# Patient Record
Sex: Female | Born: 1955 | Race: White | Hispanic: No | Smoking: Never smoker
Health system: Southern US, Community
[De-identification: ages and names within clinical notes are randomized; demographics above are authoritative.]

## PROBLEM LIST (undated history)

## (undated) DIAGNOSIS — F329 Major depressive disorder, single episode, unspecified: Secondary | ICD-10-CM

## (undated) DIAGNOSIS — E785 Hyperlipidemia, unspecified: Secondary | ICD-10-CM

## (undated) DIAGNOSIS — R0789 Other chest pain: Secondary | ICD-10-CM

## (undated) DIAGNOSIS — F32A Depression, unspecified: Secondary | ICD-10-CM

## (undated) DIAGNOSIS — G4730 Sleep apnea, unspecified: Secondary | ICD-10-CM

## (undated) DIAGNOSIS — K635 Polyp of colon: Secondary | ICD-10-CM

## (undated) HISTORY — DX: Polyp of colon: K63.5

## (undated) HISTORY — DX: Other chest pain: R07.89

## (undated) HISTORY — DX: Hyperlipidemia, unspecified: E78.5

## (undated) HISTORY — PX: COLONOSCOPY: SHX174

## (undated) HISTORY — DX: Depression, unspecified: F32.A

## (undated) HISTORY — DX: Major depressive disorder, single episode, unspecified: F32.9

---
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
## 1984-11-05 HISTORY — PX: EXPLORATORY LAPAROTOMY: SUR591

## 1999-06-07 ENCOUNTER — Other Ambulatory Visit: Admission: RE | Admit: 1999-06-07 | Discharge: 1999-06-07 | Payer: Self-pay | Admitting: Obstetrics & Gynecology

## 1999-06-07 ENCOUNTER — Encounter (INDEPENDENT_AMBULATORY_CARE_PROVIDER_SITE_OTHER): Payer: Self-pay

## 2001-11-05 HISTORY — PX: TOTAL ABDOMINAL HYSTERECTOMY W/ BILATERAL SALPINGOOPHORECTOMY: SHX83

## 2002-11-05 HISTORY — PX: HERNIA REPAIR: SHX51

## 2007-12-16 ENCOUNTER — Emergency Department (HOSPITAL_COMMUNITY): Admission: EM | Admit: 2007-12-16 | Discharge: 2007-12-16 | Payer: Self-pay | Admitting: Emergency Medicine

## 2009-03-18 IMAGING — CT CT ABDOMEN W/ CM
1 of 3 series · 14 of 32 positions shown, 19 images · IV contrast (Omnipaque 300)
Comparison: none

HISTORY: Mid abdominal pain, nausea, vomiting, diabetes

[Series 2: abd_pel 5.0 b40f · axial · 0.79mm/px · z∈[-510,-80]mm · 14 of 98 slices shown, 19 images]
[im 6/98  soft-tissue]
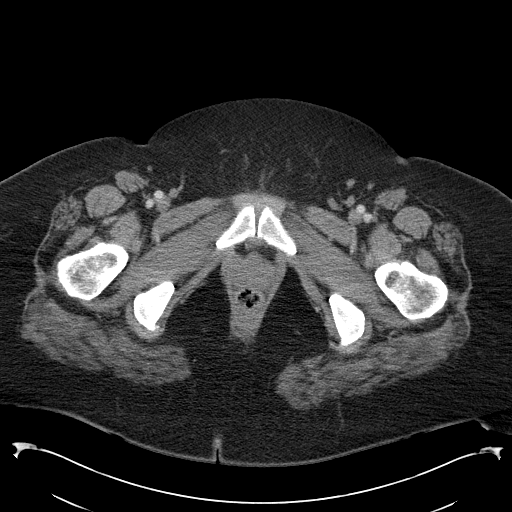
[im 6/98  bone]
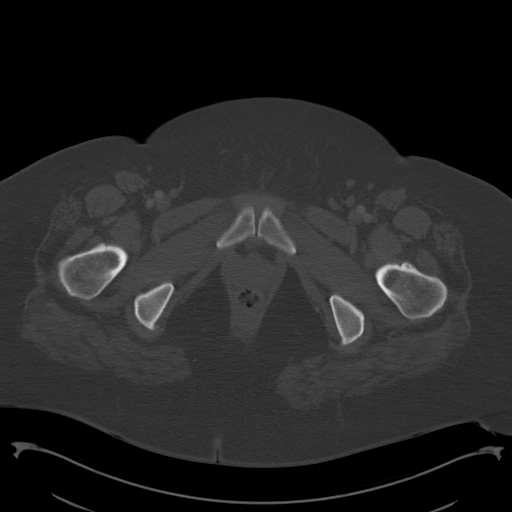
[im 12/98  soft-tissue]
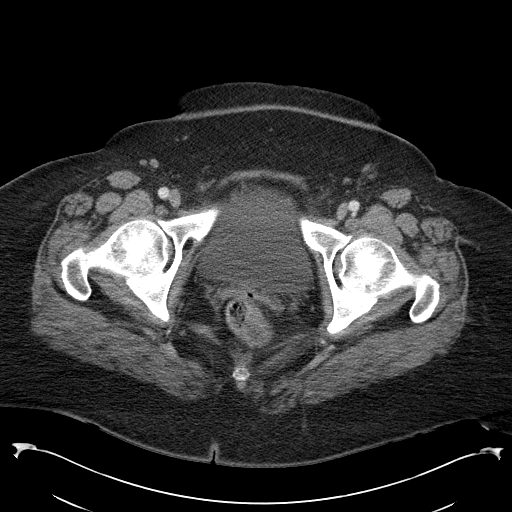
[im 23/98  soft-tissue]
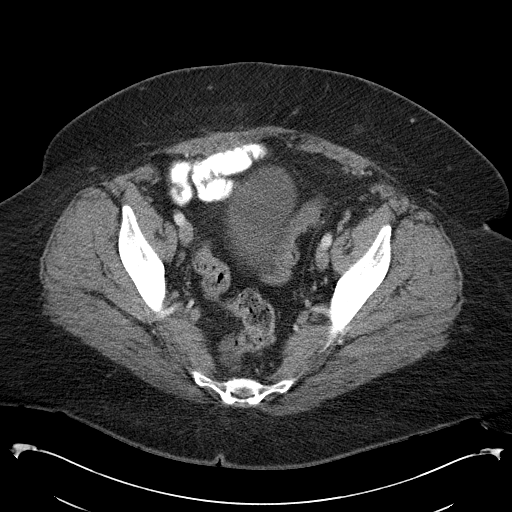
[im 29/98  soft-tissue]
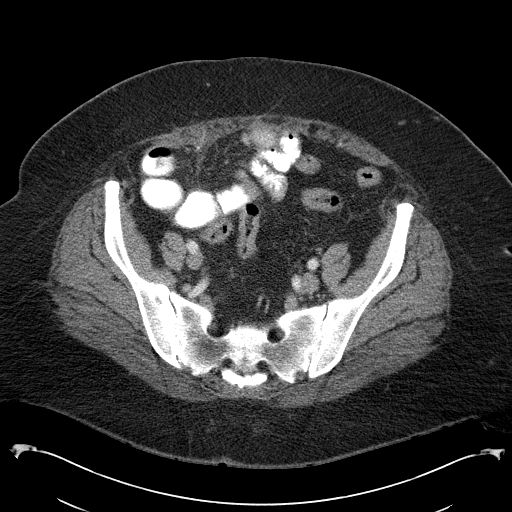
[im 35/98  soft-tissue]
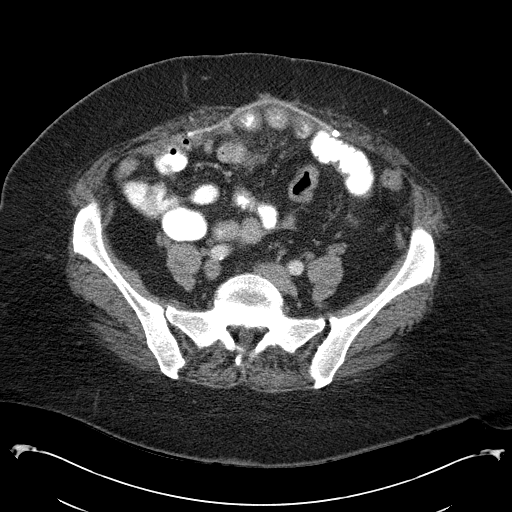
[im 40/98  soft-tissue]
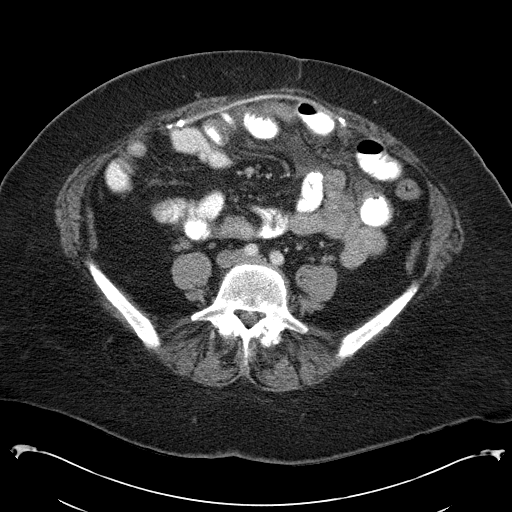
[im 52/98  soft-tissue]
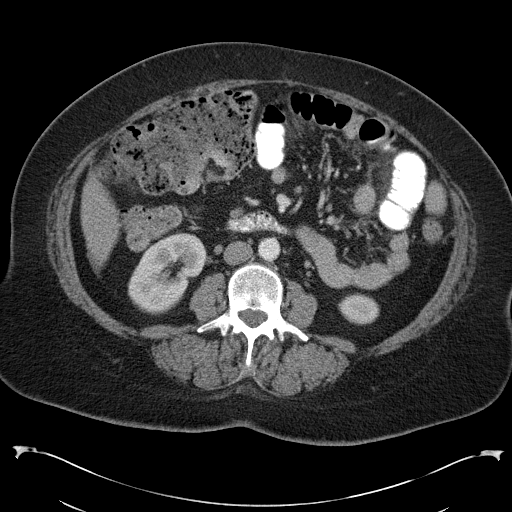
[im 58/98  soft-tissue]
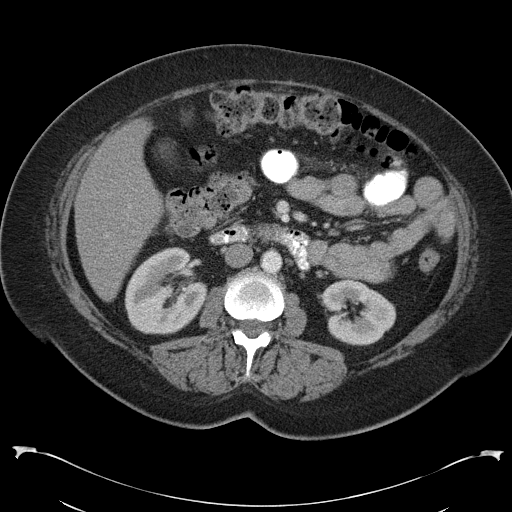
[im 63/98  soft-tissue]
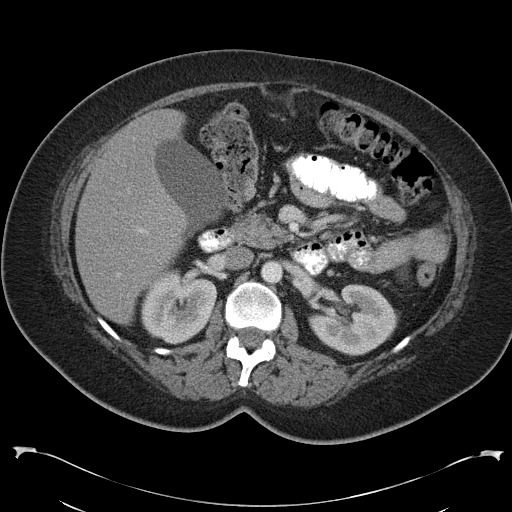
[im 63/98  bone]
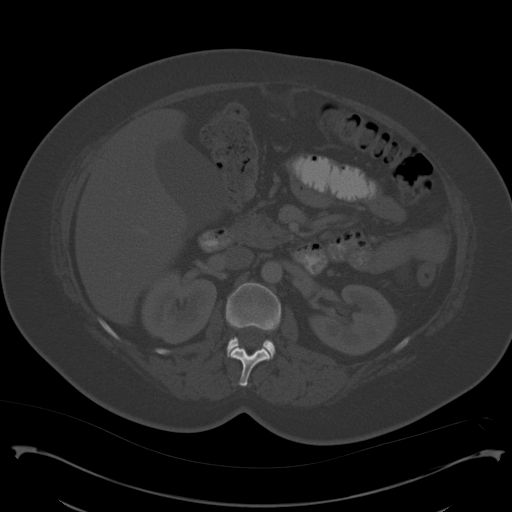
[im 69/98  soft-tissue]
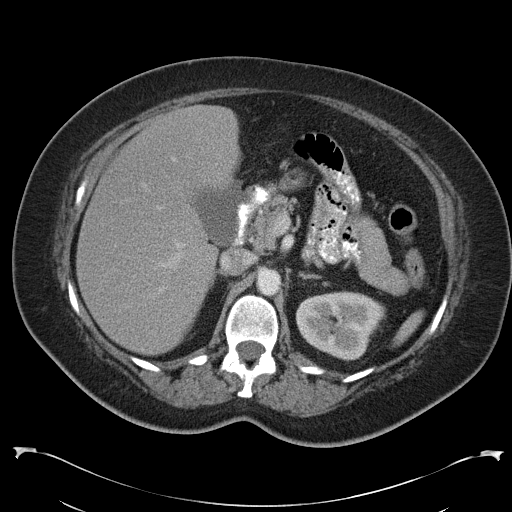
[im 75/98  soft-tissue]
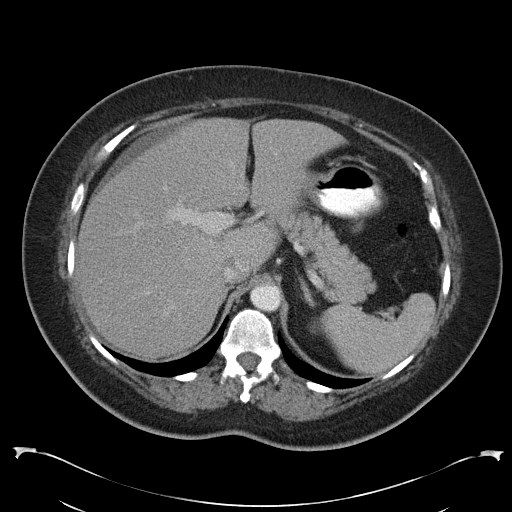
[im 75/98  lung]
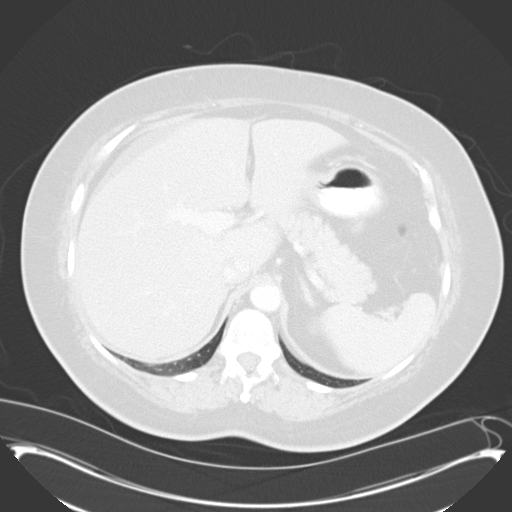
[im 80/98  lung]
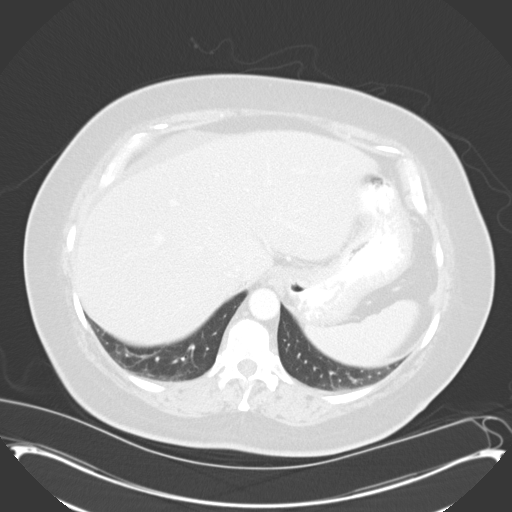
[im 86/98  soft-tissue]
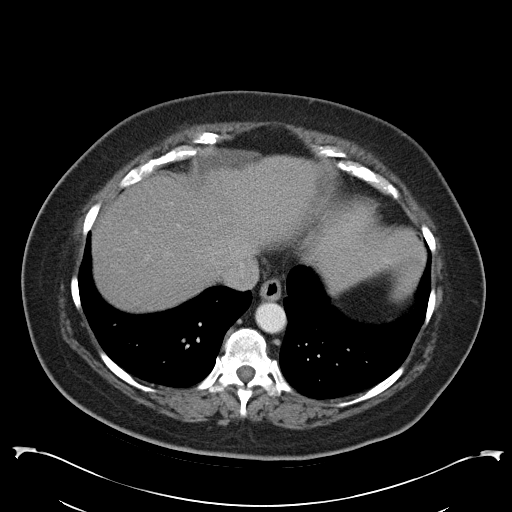
[im 86/98  lung]
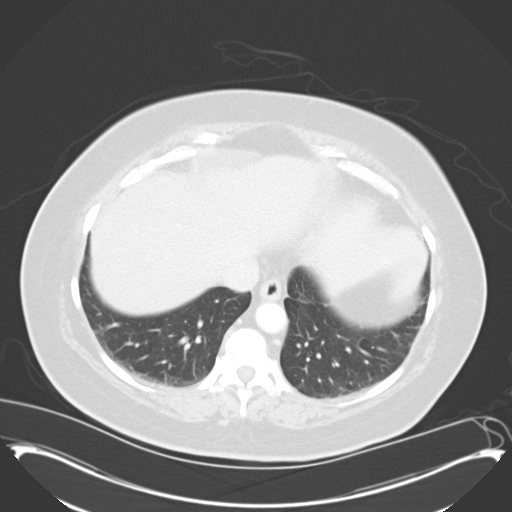
[im 92/98  soft-tissue]
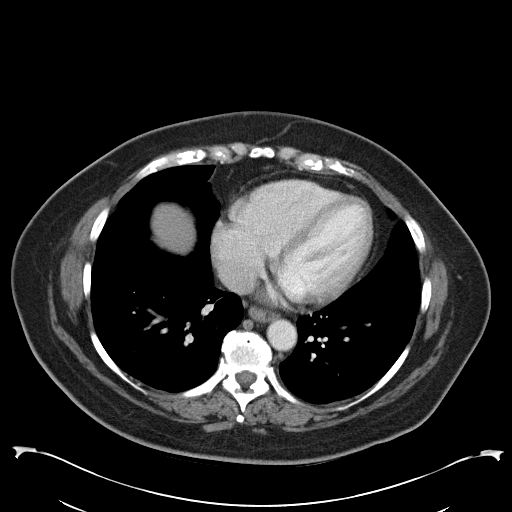
[im 92/98  lung]
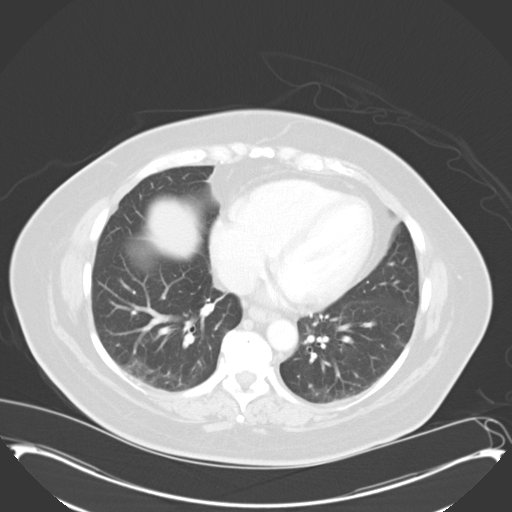

[14 of 32 positions shown; findings below may reference images not displayed]

CT ABDOMEN AND PELVIS WITH CONTRAST:

Multidetector helical CT imaging abdomen and pelvis performed.
Sagittal and coronal images are reconstructed from the axial data set.
Exam utilized dilute oral contrast and 100 cc Hmnipaque-TPP.
No prior exam for comparison.

CT ABDOMEN:

Minimal dependent atelectasis at lung bases.
Small amount of free perihepatic fluid and upper abdomen.
Liver, spleen, pancreas, and kidneys, and adrenal glands normal.
Gallbladder appears distended without calcification or definite wall thickening.
Increased stool in proximal half of colon.
Prior ventral hernia repair with mesh.
Few minimally prominent proximal small bowel loops without definite obstruction.
Minimal mesenteric edema of these associated small bowel loops.
No upper abdominal mass, adenopathy, or free fluid.
IMPRESSION: Small amount nonspecific perihepatic fluid, as well as minimally prominent small
bowel loops with mesenteric edema, see below.

CT PELVIS:

Mild edema identified within small bowel mesentery in upper mid pelvis.
This extends into the left midabdomen.
Few small bowel loops in this region show questionably thickened wall of these
are under distended by contrast and may be artifactual.
Distal small bowel loops unopacified.
No associated abscess collection, extraluminal gas, or free air.
Colon decompressed.
Bladder and distal ureters unremarkable.
No bone lesions.
IMPRESSION: Inflammatory process involving the mesentery of small bowel loops in the upper
pelvis extending into the mid left abdomen, nonspecific.
Differential diagnosis would include early small bowel obstruction, internal
hernia, adhesion, cannot completely exclude ischemia.
Followup exams as clinically warranted.

## 2009-08-04 DIAGNOSIS — K56609 Unspecified intestinal obstruction, unspecified as to partial versus complete obstruction: Secondary | ICD-10-CM | POA: Insufficient documentation

## 2009-08-04 DIAGNOSIS — E119 Type 2 diabetes mellitus without complications: Secondary | ICD-10-CM | POA: Insufficient documentation

## 2009-08-04 DIAGNOSIS — F329 Major depressive disorder, single episode, unspecified: Secondary | ICD-10-CM

## 2009-08-04 DIAGNOSIS — E785 Hyperlipidemia, unspecified: Secondary | ICD-10-CM

## 2009-08-10 ENCOUNTER — Encounter (INDEPENDENT_AMBULATORY_CARE_PROVIDER_SITE_OTHER): Payer: Self-pay | Admitting: *Deleted

## 2009-08-10 ENCOUNTER — Ambulatory Visit: Payer: Self-pay | Admitting: Cardiology

## 2009-08-10 DIAGNOSIS — R079 Chest pain, unspecified: Secondary | ICD-10-CM

## 2009-08-18 ENCOUNTER — Ambulatory Visit (HOSPITAL_COMMUNITY): Admission: RE | Admit: 2009-08-18 | Discharge: 2009-08-18 | Payer: Self-pay | Admitting: Cardiology

## 2009-08-18 ENCOUNTER — Encounter: Payer: Self-pay | Admitting: Cardiology

## 2009-08-18 ENCOUNTER — Ambulatory Visit: Payer: Self-pay | Admitting: Cardiology

## 2009-08-25 ENCOUNTER — Encounter (INDEPENDENT_AMBULATORY_CARE_PROVIDER_SITE_OTHER): Payer: Self-pay | Admitting: *Deleted

## 2009-09-09 ENCOUNTER — Encounter: Payer: Self-pay | Admitting: Cardiology

## 2010-08-23 ENCOUNTER — Ambulatory Visit: Payer: Self-pay | Admitting: Specialist

## 2010-11-05 HISTORY — PX: LAPAROSCOPIC GASTRIC SLEEVE RESECTION: SHX5895

## 2011-04-17 ENCOUNTER — Ambulatory Visit: Payer: Self-pay | Admitting: Specialist

## 2011-04-17 DIAGNOSIS — Z01810 Encounter for preprocedural cardiovascular examination: Secondary | ICD-10-CM

## 2011-05-24 ENCOUNTER — Ambulatory Visit: Payer: Self-pay | Admitting: Specialist

## 2011-06-06 ENCOUNTER — Ambulatory Visit: Payer: Self-pay | Admitting: Specialist

## 2011-06-06 ENCOUNTER — Ambulatory Visit: Payer: Self-pay | Admitting: Gastroenterology

## 2011-06-07 LAB — PATHOLOGY REPORT

## 2011-06-19 ENCOUNTER — Ambulatory Visit: Payer: Self-pay | Admitting: Specialist

## 2011-07-27 LAB — URINALYSIS, ROUTINE W REFLEX MICROSCOPIC
Glucose, UA: NEGATIVE
Leukocytes, UA: NEGATIVE
Specific Gravity, Urine: >1.03 — ABNORMAL HIGH
pH: 6

## 2011-07-27 LAB — DIFFERENTIAL
Basophils Relative: 0
Eosinophils Absolute: 0
Eosinophils Relative: 0
Monocytes Relative: 2 — ABNORMAL LOW
Neutrophils Relative %: 92 — ABNORMAL HIGH

## 2011-07-27 LAB — URINE MICROSCOPIC-ADD ON: Urine-Other: NONE SEEN

## 2011-07-27 LAB — COMPREHENSIVE METABOLIC PANEL
ALT: 42 — ABNORMAL HIGH
AST: 32
Alkaline Phosphatase: 74
CO2: 29
GFR calc Af Amer: >60
GFR calc non Af Amer: >60
Glucose, Bld: 193 — ABNORMAL HIGH
Potassium: 4.3
Sodium: 138
Total Protein: 6.8

## 2011-07-27 LAB — CBC
Hemoglobin: 14.6
RBC: 4.72

## 2011-08-01 ENCOUNTER — Ambulatory Visit: Payer: Self-pay | Admitting: Specialist

## 2011-08-07 ENCOUNTER — Ambulatory Visit: Payer: Self-pay | Admitting: Specialist

## 2011-08-28 ENCOUNTER — Inpatient Hospital Stay: Payer: Self-pay | Admitting: Specialist

## 2011-08-30 LAB — PATHOLOGY REPORT

## 2011-10-31 ENCOUNTER — Encounter: Payer: Self-pay | Admitting: Cardiology

## 2015-08-03 DIAGNOSIS — R300 Dysuria: Secondary | ICD-10-CM | POA: Insufficient documentation

## 2015-11-18 ENCOUNTER — Encounter: Payer: Self-pay | Admitting: *Deleted

## 2015-12-06 ENCOUNTER — Ambulatory Visit (INDEPENDENT_AMBULATORY_CARE_PROVIDER_SITE_OTHER): Payer: Federal, State, Local not specified - PPO | Admitting: General Surgery

## 2015-12-06 ENCOUNTER — Encounter: Payer: Self-pay | Admitting: General Surgery

## 2015-12-06 VITALS — BP 112/62 | HR 74 | Resp 14 | Ht 67.5 in | Wt 203.0 lb

## 2015-12-06 DIAGNOSIS — Z86010 Personal history of colonic polyps: Secondary | ICD-10-CM | POA: Diagnosis not present

## 2015-12-06 DIAGNOSIS — K625 Hemorrhage of anus and rectum: Secondary | ICD-10-CM | POA: Diagnosis not present

## 2015-12-06 MED ORDER — POLYETHYLENE GLYCOL 3350 17 GM/SCOOP PO POWD: 1.0000 | Freq: Once | ORAL | Status: DC

## 2015-12-06 NOTE — Patient Instructions (Addendum)
Colonoscopy A colonoscopy is an exam to look at the entire large intestine (colon). This exam can help find problems such as tumors, polyps, inflammation, and areas of bleeding. The exam takes about 1 hour.  LET [facility] CARE PROVIDER KNOW ABOUT:   Any allergies you have.  All medicines you are taking, including vitamins, herbs, eye drops, creams, and over-the-counter medicines.  Previous problems you or members of your family have had with the use of anesthetics.  Any blood disorders you have.  Previous surgeries you have had.  Medical conditions you have. RISKS AND COMPLICATIONS  Generally, this is a safe procedure. However, as with any procedure, complications can occur. Possible complications include:  Bleeding.  Tearing or rupture of the colon wall.  Reaction to medicines given during the exam.  Infection (rare). BEFORE THE PROCEDURE   Ask your health care provider about changing or stopping your regular medicines.  You may be prescribed an oral bowel prep. This involves drinking a large amount of medicated liquid, starting the day before your procedure. The liquid will cause you to have multiple loose stools until your stool is almost clear or light green. This cleans out your colon in preparation for the procedure.  Do not eat or drink anything else once you have started the bowel prep, unless your health care provider tells you it is safe to do so.  Arrange for someone to drive you home after the procedure. PROCEDURE   You will be given medicine to help you relax (sedative).  You will lie on your side with your knees bent.  A long, flexible tube with a light and camera on the end (colonoscope) will be inserted through the rectum and into the colon. The camera sends video back to a computer screen as it moves through the colon. The colonoscope also releases carbon dioxide gas to inflate the colon. This helps your health care provider see the area better.  During  the exam, your health care provider may take a small tissue sample (biopsy) to be examined under a microscope if any abnormalities are found.  The exam is finished when the entire colon has been viewed. AFTER THE PROCEDURE   Do not drive for 24 hours after the exam.  You may have a small amount of blood in your stool.  You may pass moderate amounts of gas and have mild abdominal cramping or bloating. This is caused by the gas used to inflate your colon during the exam.  Ask when your test results will be ready and how you will get your results. Make sure you get your test results.   This information is not intended to replace advice given to you by your health care provider. Make sure you discuss any questions you have with your health care provider.   Document Released: 10/19/2000 Document Revised: 08/12/2013 Document Reviewed: 06/29/2013 Elsevier Interactive Patient Education [employer]  The patient is scheduled for a Colonoscopy at [facility] on 01/18/16. She will hold her Pioglitazone-Metformin the day of prep and procedure. She will stop her Fish Oil one week prior. They are aware to call the day before to get their arrival time. Miralax prescription has been sent into the patient's pharmacy. The patient is aware of date and instructions.

## 2015-12-06 NOTE — H&P (Signed)
HPI  Melinda Phelps is a 60 y.o. female here today for an evaluation of a screening colonoscopy and hemorrhoids. Patient states she notices some rectal bleeding and rectal pain from her hemorrhoids about twice a month lasting 1-2 days. She has had the hemorrhoids since her first pregnancy 30 years ago. She states they will occasionally swell.  She has been followed by [facility] gynecology Dr Newman Nip for vaginal issues.  Last colonoscopy was 2010?Marland Kitchen Records from that exam are not available for review.  Denies family history colon cancer. She is her today with her husband.  The patient had a 70 pound weight loss after gastric sleeve surgery in 2012. Her weight has been stable now for several years.  I personally reviewed the patient's history.  HPI  Past Medical History   Diagnosis  Date   .  Hyperlipidemia    .  Diabetes mellitus    .  Chest discomfort    .  Depression    .  Colon polyp  2010?    Past Surgical History   Procedure  Laterality  Date   .  Exploratory laparotomy   1986   .  Total abdominal hysterectomy w/ bilateral salpingoophorectomy   2003   .  Hernia repair   2004   .  Tonsillectomy   1964   .  Cesarean section   1986, 1991, 1997   .  Laparoscopic gastric sleeve resection   2012     Dr Darnell Level   .  Colonoscopy   2010?     Dr Tomasa Rand in [place]    Family History   Problem  Relation  Age of Onset   .  Heart failure  Father    .  Heart attack  Brother    .  Cancer  Mother  49     lung    Social History  Social History   Substance Use Topics   .  Smoking status:  Never Smoker   .  Smokeless tobacco:  Not on file   .  Alcohol Use:  No    No Known Allergies  Current Outpatient Prescriptions   Medication  Sig  Dispense  Refill   .  aspirin 81 MG tablet  Take 81 mg by mouth daily.     Marland Kitchen  atorvastatin (LIPITOR) 40 MG tablet  Take 40 mg by mouth daily.     .  clobetasol cream (TEMOVATE) 3.71 %  Apply 1 application topically as directed.     .  desloratadine (CLARINEX)  5 MG tablet  Take 5 mg by mouth daily.     .  enalapril (VASOTEC) 10 MG tablet  Take 10 mg by mouth daily.     Marland Kitchen  escitalopram (LEXAPRO) 20 MG tablet  Take 20 mg by mouth daily.     .  Estradiol-Estriol-Progesterone (BIEST/PROGESTERONE) CREA  Place onto the skin as directed.     .  estrogens conjugated, synthetic A, (CENESTIN) 0.45 MG tablet  Take 0.45 mg by mouth daily.     .  fish oil-omega-3 fatty acids 1000 MG capsule  Take 1 capsule by mouth daily.     .  Multiple Vitamin (MULTIVITAMIN) tablet  Take 1 tablet by mouth daily.     .  pioglitazone-metformin (ACTOPLUS MET) 15-500 MG per tablet  Take 1 tablet by mouth 2 (two) times daily with a meal.     .  polyethylene glycol powder (GLYCOLAX/MIRALAX) powder  Take 255 g by mouth  once.  255 g  0    No current facility-administered medications for this visit.    Review of Systems  Review of Systems  Constitutional: Negative.  Respiratory: Negative.  Cardiovascular: Negative.  Gastrointestinal: Positive for anal bleeding and rectal pain. Negative for vomiting, diarrhea and constipation.   Blood pressure 112/62, pulse 74, resp. rate 14, height 5' 7.5" (1.715 m), weight 203 lb (92.08 kg).  Physical Exam  Physical Exam  Constitutional: She is oriented to person, place, and time. She appears well-developed and well-nourished.  HENT:  Mouth/Throat: Oropharynx is clear and moist.  Eyes: Conjunctivae are normal. No scleral icterus.  Neck: Neck supple.  Cardiovascular: Normal rate, regular rhythm and normal heart sounds.  Pulses:  Femoral pulses are 2+ on the right side, and 2+ on the left side.  Posterior tibial pulses are 2+ on the right side, and 2+ on the left side.  Pulmonary/Chest: Effort normal and breath sounds normal.  Abdominal: Soft. Normal appearance and bowel sounds are normal. There is no tenderness. No hernia.  Genitourinary: Rectal exam shows internal hemorrhoid. Guaiac negative stool.     Small external skin tags. Small  internal hemorrhoids.    Lymphadenopathy:  She has no cervical adenopathy.  Neurological: She is alert and oriented to person, place, and time.  Skin: Skin is warm and dry.  Psychiatric: Her behavior is normal.   Data Reviewed  PCP notes of 11/17/2015 were reviewed. Hemoglobin 13.3 with an MCV of 91. White blood cell count of 8900. Normal electrolytes with the exception of a minimal elevation of the sodium at 145. Creatinine low at 0.5. Blood sugar 162 (unclear if this is a fasting sample) sees. Normal liver function studies.  Anoscopy showed some very small 2-3 mm internal hemorrhoids. No bleeding source identified.  Assessment   Past history colonic polyps, recent history of rectal bleeding.   Plan   Colonoscopy with possible biopsy/polypectomy prn: Information regarding the procedure, including its potential risks and complications (including but not limited to perforation of the bowel, which may require emergency surgery to repair, and bleeding) was verbally given to the patient. Educational information regarding lower intestinal endoscopy was given to the patient. Written instructions for how to complete the bowel prep using Miralax were provided. The importance of drinking ample fluids to avoid dehydration as a result of the prep emphasized.  This information has been scribed by Karie Fetch RNBC.  The patient is scheduled for a Colonoscopy at [facility] on 01/18/16. She will hold her Pioglitazone-Metformin the day of prep and procedure. She will stop her Fish Oil one week prior. They are aware to call the day before to get their arrival time. Miralax prescription has been sent into the patient's pharmacy. The patient is aware of date and instructions.  PCP: Hyman Hopes

## 2015-12-06 NOTE — Progress Notes (Signed)
Patient ID: Melinda Phelps, female   DOB: 12/02/1955, 60 y.o.   MRN: [id number]  Chief Complaint  Patient presents with  . Colonoscopy    hemorrhoids    HPI Melinda Phelps is a 60 y.o. female here today for an evaluation of a screening colonoscopy and hemorrhoids. Patient states she notices some rectal bleeding and rectal pain from her hemorrhoids about twice a month lasting 1-2 days. She has had the hemorrhoids since her first pregnancy 30 years ago. She states they will occasionally swell. She has been followed by [facility] gynecology Dr Newman Nip for vaginal issues. Last colonoscopy was 2010?Marland Kitchen Records from that exam are not available for review. Denies family history colon cancer. She is her today with her husband.  The patient had a 70 pound weight loss after gastric sleeve surgery in 2012. Her weight has been stable now for several years.  I personally reviewed the patient's history.   HPI  Past Medical History  Diagnosis Date  . Hyperlipidemia   . Diabetes mellitus   . Chest discomfort   . Depression   . Colon polyp 2010?    Past Surgical History  Procedure Laterality Date  . Exploratory laparotomy  1986  . Total abdominal hysterectomy w/ bilateral salpingoophorectomy  2003  . Hernia repair  2004  . Tonsillectomy  1964  . Cesarean section  1986, 1991, 1997  . Laparoscopic gastric sleeve resection  2012    Dr Darnell Level  . Colonoscopy  2010?    Dr Tomasa Rand in [place]    Family History  Problem Relation Age of Onset  . Heart failure Father   . Heart attack Brother   . Cancer Mother 48    lung    Social History Social History  Substance Use Topics  . Smoking status: Never Smoker   . Smokeless tobacco: Not on file  . Alcohol Use: No    No Known Allergies  Current Outpatient Prescriptions  Medication Sig Dispense Refill  . aspirin 81 MG tablet Take 81 mg by mouth daily.      Marland Kitchen atorvastatin (LIPITOR) 40 MG tablet Take 40 mg by mouth daily.      . clobetasol  cream (TEMOVATE) 8.84 % Apply 1 application topically as directed.    . desloratadine (CLARINEX) 5 MG tablet Take 5 mg by mouth daily.      . enalapril (VASOTEC) 10 MG tablet Take 10 mg by mouth daily.      Marland Kitchen escitalopram (LEXAPRO) 20 MG tablet Take 20 mg by mouth daily.      . Estradiol-Estriol-Progesterone (BIEST/PROGESTERONE) CREA Place onto the skin as directed.    . estrogens conjugated, synthetic A, (CENESTIN) 0.45 MG tablet Take 0.45 mg by mouth daily.      . fish oil-omega-3 fatty acids 1000 MG capsule Take 1 capsule by mouth daily.      . Multiple Vitamin (MULTIVITAMIN) tablet Take 1 tablet by mouth daily.      . pioglitazone-metformin (ACTOPLUS MET) 15-500 MG per tablet Take 1 tablet by mouth 2 (two) times daily with a meal.      . polyethylene glycol powder (GLYCOLAX/MIRALAX) powder Take 255 g by mouth once. 255 g 0   No current facility-administered medications for this visit.    Review of Systems Review of Systems  Constitutional: Negative.   Respiratory: Negative.   Cardiovascular: Negative.   Gastrointestinal: Positive for anal bleeding and rectal pain. Negative for vomiting, diarrhea and constipation.    Blood pressure 112/62,  pulse 74, resp. rate 14, height 5' 7.5" (1.715 m), weight 203 lb (92.08 kg).  Physical Exam Physical Exam  Constitutional: She is oriented to person, place, and time. She appears well-developed and well-nourished.  HENT:  Mouth/Throat: Oropharynx is clear and moist.  Eyes: Conjunctivae are normal. No scleral icterus.  Neck: Neck supple.  Cardiovascular: Normal rate, regular rhythm and normal heart sounds.   Pulses:      Femoral pulses are 2+ on the right side, and 2+ on the left side.      Posterior tibial pulses are 2+ on the right side, and 2+ on the left side.  Pulmonary/Chest: Effort normal and breath sounds normal.  Abdominal: Soft. Normal appearance and bowel sounds are normal. There is no tenderness. No hernia.  Genitourinary: Rectal  exam shows internal hemorrhoid. Guaiac negative stool.     Small external skin tags. Small internal hemorrhoids.     Lymphadenopathy:    She has no cervical adenopathy.  Neurological: She is alert and oriented to person, place, and time.  Skin: Skin is warm and dry.  Psychiatric: Her behavior is normal.    Data Reviewed PCP notes of 11/17/2015 were reviewed. Hemoglobin 13.3 with an MCV of 91. White blood cell count of 8900. Normal electrolytes with the exception of a minimal elevation of the sodium at 145. Creatinine low at 0.5. Blood sugar 162 (unclear if this is a fasting sample) sees. Normal liver function studies.  Anoscopy showed some very small 2-3 mm internal hemorrhoids. No bleeding source identified.  Assessment    Past history colonic polyps, recent history of rectal bleeding.    Plan       Colonoscopy with possible biopsy/polypectomy prn: Information regarding the procedure, including its potential risks and complications (including but not limited to perforation of the bowel, which may require emergency surgery to repair, and bleeding) was verbally given to the patient. Educational information regarding lower intestinal endoscopy was given to the patient. Written instructions for how to complete the bowel prep using Miralax were provided. The importance of drinking ample fluids to avoid dehydration as a result of the prep emphasized.  This information has been scribed by Karie Fetch RNBC.  The patient is scheduled for a Colonoscopy at [facility] on 01/18/16. She will hold her Pioglitazone-Metformin the day of prep and procedure. She will stop her Fish Oil one week prior. They are aware to call the day before to get their arrival time. Miralax prescription has been sent into the patient's pharmacy. The patient is aware of date and instructions.   PCP:  Hyman Hopes 12/06/2015, 9:06 PM

## 2015-12-12 ENCOUNTER — Ambulatory Visit: Payer: Self-pay | Admitting: General Surgery

## 2016-01-16 ENCOUNTER — Telehealth: Payer: Self-pay | Admitting: *Deleted

## 2016-01-16 NOTE — Telephone Encounter (Signed)
Patient contacted today and medication list was reviewed with the patient. Medication list was updated accordingly.  This patient reports that she has Miralax prescription.   We will proceed with colonoscopy as scheduled for 01-18-16 at [facility].   Patient was instructed to contact the office should she have further questions.

## 2016-01-17 ENCOUNTER — Encounter: Payer: Self-pay | Admitting: *Deleted

## 2016-01-18 ENCOUNTER — Encounter
Admission: RE | Disposition: A | Discharge status: Home / Self Care | Payer: Self-pay | Source: Ambulatory Visit | Attending: General Surgery

## 2016-01-18 ENCOUNTER — Ambulatory Visit
Admission: RE | Admit: 2016-01-18 | Discharge: 2016-01-18 | Disposition: A | Discharge status: Home / Self Care | Payer: Federal, State, Local not specified - PPO | Source: Ambulatory Visit | Attending: General Surgery | Admitting: General Surgery

## 2016-01-18 ENCOUNTER — Ambulatory Visit: Payer: Federal, State, Local not specified - PPO | Admitting: Anesthesiology

## 2016-01-18 ENCOUNTER — Encounter: Payer: Self-pay | Admitting: *Deleted

## 2016-01-18 DIAGNOSIS — Z79899 Other long term (current) drug therapy: Secondary | ICD-10-CM | POA: Diagnosis not present

## 2016-01-18 DIAGNOSIS — Z86010 Personal history of colonic polyps: Secondary | ICD-10-CM | POA: Insufficient documentation

## 2016-01-18 DIAGNOSIS — Z7984 Long term (current) use of oral hypoglycemic drugs: Secondary | ICD-10-CM | POA: Insufficient documentation

## 2016-01-18 DIAGNOSIS — F329 Major depressive disorder, single episode, unspecified: Secondary | ICD-10-CM | POA: Insufficient documentation

## 2016-01-18 DIAGNOSIS — G4730 Sleep apnea, unspecified: Secondary | ICD-10-CM | POA: Diagnosis not present

## 2016-01-18 DIAGNOSIS — E119 Type 2 diabetes mellitus without complications: Secondary | ICD-10-CM | POA: Diagnosis not present

## 2016-01-18 DIAGNOSIS — K625 Hemorrhage of anus and rectum: Secondary | ICD-10-CM | POA: Insufficient documentation

## 2016-01-18 DIAGNOSIS — Z1211 Encounter for screening for malignant neoplasm of colon: Secondary | ICD-10-CM | POA: Diagnosis not present

## 2016-01-18 DIAGNOSIS — R079 Chest pain, unspecified: Secondary | ICD-10-CM | POA: Insufficient documentation

## 2016-01-18 DIAGNOSIS — E785 Hyperlipidemia, unspecified: Secondary | ICD-10-CM | POA: Insufficient documentation

## 2016-01-18 DIAGNOSIS — Z90710 Acquired absence of both cervix and uterus: Secondary | ICD-10-CM | POA: Diagnosis not present

## 2016-01-18 HISTORY — PX: COLONOSCOPY WITH PROPOFOL: SHX5780

## 2016-01-18 HISTORY — DX: Sleep apnea, unspecified: G47.30

## 2016-01-18 LAB — GLUCOSE, CAPILLARY: Glucose-Capillary: 168 mg/dL — ABNORMAL HIGH (ref 65–99)

## 2016-01-18 SURGERY — COLONOSCOPY WITH PROPOFOL
Anesthesia: General

## 2016-01-18 MED ORDER — FENTANYL CITRATE (PF) 100 MCG/2ML IJ SOLN: 25.0000 ug | INTRAMUSCULAR | Status: DC | PRN

## 2016-01-18 MED ORDER — PROPOFOL 10 MG/ML IV BOLUS
INTRAVENOUS | Status: DC | PRN
  Administered 2016-01-18: 50 mg via INTRAVENOUS

## 2016-01-18 MED ORDER — GLYCOPYRROLATE 0.2 MG/ML IJ SOLN
INTRAMUSCULAR | Status: DC | PRN
  Administered 2016-01-18: 0.2 mg via INTRAVENOUS

## 2016-01-18 MED ORDER — MIDAZOLAM HCL 2 MG/2ML IJ SOLN
INTRAMUSCULAR | Status: DC | PRN
  Administered 2016-01-18: 1 mg via INTRAVENOUS

## 2016-01-18 MED ORDER — ONDANSETRON HCL 4 MG/2ML IJ SOLN: 4.0000 mg | Freq: Once | INTRAMUSCULAR | Status: DC | PRN

## 2016-01-18 MED ORDER — LIDOCAINE HCL (CARDIAC) 20 MG/ML IV SOLN
INTRAVENOUS | Status: DC | PRN
  Administered 2016-01-18: 60 mg via INTRAVENOUS

## 2016-01-18 MED ORDER — SODIUM CHLORIDE 0.9 % IV SOLN
INTRAVENOUS | Status: DC
  Administered 2016-01-18: 10:00:00 via INTRAVENOUS
  Administered 2016-01-18: 1000 mL via INTRAVENOUS

## 2016-01-18 MED ORDER — PHENYLEPHRINE HCL 10 MG/ML IJ SOLN
INTRAMUSCULAR | Status: DC | PRN
  Administered 2016-01-18: 100 ug via INTRAVENOUS

## 2016-01-18 MED ORDER — PROPOFOL 500 MG/50ML IV EMUL
INTRAVENOUS | Status: DC | PRN
  Administered 2016-01-18: 130 ug/kg/min via INTRAVENOUS

## 2016-01-18 NOTE — Transfer of Care (Signed)
Immediate Anesthesia Transfer of Care Note  Patient: Melinda Phelps  Procedure(s) Performed: Procedure(s): COLONOSCOPY WITH PROPOFOL (N/A)  Patient Location: Endoscopy Unit  Anesthesia Type:General  Level of Consciousness: awake, alert , oriented and patient cooperative  Airway & Oxygen Therapy: Patient Spontanous Breathing and Patient connected to nasal cannula oxygen  Post-op Assessment: Report given to RN, Post -op Vital signs reviewed and stable and Patient moving all extremities X 4  Post vital signs: Reviewed and stable  Last Vitals:  Filed Vitals:   01/18/16 0925  BP: 139/82  Pulse: 54  Temp: 36.4 C  Resp: 18    Complications: No apparent anesthesia complications

## 2016-01-18 NOTE — H&P (Signed)
Melinda Phelps [id number] Mar 14, 1956     HPI: Past history rectal bleeding. No bleeding since January exam.  For colonoscopy.No change in health status. Tolerated prep well.   Prescriptions prior to admission  Medication Sig Dispense Refill Last Dose  . atorvastatin (LIPITOR) 40 MG tablet Take 40 mg by mouth daily.     Taking  . clobetasol cream (TEMOVATE) 0.05 % Apply 1 application topically as directed.     . desloratadine (CLARINEX) 5 MG tablet Take 5 mg by mouth daily.     Taking  . enalapril (VASOTEC) 10 MG tablet Take 10 mg by mouth daily.     Taking  . escitalopram (LEXAPRO) 20 MG tablet Take 20 mg by mouth daily.     Taking  . Estradiol-Estriol-Progesterone (BIEST/PROGESTERONE) CREA Place onto the skin as directed.     . estrogens conjugated, synthetic A, (CENESTIN) 0.45 MG tablet Take 0.45 mg by mouth daily.     Taking  . fish oil-omega-3 fatty acids 1000 MG capsule Take 1 capsule by mouth daily.     Taking  . metFORMIN (GLUCOPHAGE) 1000 MG tablet Take 1,000 mg by mouth 2 (two) times daily with a meal.     . Multiple Vitamin (MULTIVITAMIN) tablet Take 1 tablet by mouth daily.     Taking  . polyethylene glycol powder (GLYCOLAX/MIRALAX) powder Take 255 g by mouth once. 255 g 0    No Known Allergies Past Medical History  Diagnosis Date  . Hyperlipidemia   . Diabetes mellitus   . Chest discomfort   . Depression   . Colon polyp 2010?  Marland Kitchen. Sleep apnea    Past Surgical History  Procedure Laterality Date  . Exploratory laparotomy  1986  . Total abdominal hysterectomy w/ bilateral salpingoophorectomy  2003  . Hernia repair  2004  . Tonsillectomy  1964  . Cesarean section  1986, 1991, 1997  . Laparoscopic gastric sleeve resection  2012    Dr Smitty CordsBruce  . Colonoscopy  2010?    [place]   Social History   Social History  . Marital Status: Married    Spouse Name: N/A  . Number of Children: N/A  . Years of Education: N/A   Occupational History  .  [employer]   Social History Main Topics  . Smoking status: Never Smoker   . Smokeless tobacco: Not on file  . Alcohol Use: No  . Drug Use: No  . Sexual Activity: Not on file   Other Topics Concern  . Not on file   Social History Narrative   No regular exercise   Social History   Social History Narrative   No regular exercise     ROS: Negative.     PE: HEENT: Negative. Lungs: Clear. Cardio: RR. Melinda Phelps, Melinda Phelps W 01/18/2016   Assessment/Plan:  Proceed with planned endoscopy.

## 2016-01-18 NOTE — Anesthesia Preprocedure Evaluation (Signed)
Anesthesia Evaluation  Patient identified by MRN, date of birth, ID band Patient awake    Reviewed: Allergy & Precautions, NPO status , Patient's Chart, lab work & pertinent test results  Airway Mallampati: II  TM Distance: <3 FB     Dental no notable dental hx.    Pulmonary sleep apnea ,    Pulmonary exam normal        Cardiovascular Normal cardiovascular exam  Chest pain Hx   Neuro/Psych Depression negative neurological ROS     GI/Hepatic Neg liver ROS, Colon polyp Hx   Endo/Other  diabetes, Well Controlled, Type 2, Oral Hypoglycemic Agents  Renal/GU negative Renal ROS  negative genitourinary   Musculoskeletal   Abdominal Normal abdominal exam  (+)   Peds negative pediatric ROS (+)  Hematology   Anesthesia Other Findings   Reproductive/Obstetrics                             Anesthesia Physical Anesthesia Plan  ASA: III  Anesthesia Plan: General   Post-op Pain Management:    Induction: Intravenous  Airway Management Planned: Nasal Cannula  Additional Equipment:   Intra-op Plan:   Post-operative Plan:   Informed Consent: I have reviewed the patients History and Physical, chart, labs and discussed the procedure including the risks, benefits and alternatives for the proposed anesthesia with the patient or authorized representative who has indicated his/her understanding and acceptance.   Dental advisory given  Plan Discussed with: CRNA and Surgeon  Anesthesia Plan Comments:         Anesthesia Quick Evaluation

## 2016-01-18 NOTE — Anesthesia Postprocedure Evaluation (Signed)
Anesthesia Post Note  Patient: Melinda Phelps  Procedure(s) Performed: Procedure(s) (LRB): COLONOSCOPY WITH PROPOFOL (N/A)  Patient location during evaluation: PACU Anesthesia Type: General Level of consciousness: awake and alert and oriented Pain management: pain level controlled Vital Signs Assessment: post-procedure vital signs reviewed and stable Respiratory status: spontaneous breathing Cardiovascular status: blood pressure returned to baseline Anesthetic complications: no    Last Vitals:  Filed Vitals:   01/18/16 1055 01/18/16 1105  BP: 115/70 126/78  Pulse: 59 54  Temp:    Resp: 18 14    Last Pain: There were no vitals filed for this visit.               Brantleigh Mifflin

## 2016-01-18 NOTE — Op Note (Signed)
[name]: Melinda Phelps Procedure Date: 01/18/2016 9:51 AM MRN: [id number] Account #: [id number] Date of Birth: [birth year] Admit Type: Outpatient Age: [id number] Room: [facility] END[facility] ROOM 3 Gender: Female Note Status: Finalized Procedure:            Colonoscopy Indications:          Rectal bleeding Providers:            Earline MayotteJeffrey W. Darrek Leasure, MD Referring MD:         Erasmo DownerLindsey F. Strader (Referring MD) Medicines:            Monitored Anesthesia Care Complications:        No immediate complications. Procedure:            Pre-Anesthesia Assessment:                       - Prior to the procedure, a History and Physical was                        performed, and patient medications, allergies and                        sensitivities were reviewed. The patient's tolerance of                        previous anesthesia was reviewed.                       - The risks and benefits of the procedure and the                        sedation options and risks were discussed with the                        patient. All questions were answered and informed                        consent was obtained.                       After obtaining informed consent, the colonoscope was                        passed under direct vision. Throughout the procedure,                        the patient's blood pressure, pulse, and oxygen                        saturations were monitored continuously. The                        Colonoscope was introduced through the anus and                        advanced to the the cecum, identified by the                        appendiceal orifice, ileocecal valve and palpation. The  colonoscopy was somewhat difficult due to significant                        looping and a tortuous colon. Successful completion of                        the procedure was aided by using manual pressure. The                        patient  tolerated the procedure well. The quality of                        the bowel preparation was good. Findings:      The entire examined colon appeared normal on direct and retroflexion       views. Impression:           - The entire examined colon is normal on direct and                        retroflexion views.                       - No specimens collected. Recommendation:       - Repeat colonoscopy in 10 years for screening purposes. Procedure Code(s):    --- Professional ---                       [phone], Colonoscopy, flexible; diagnostic, including                        collection of specimen(s) by brushing or washing, when                        performed (separate procedure) Diagnosis Code(s):    --- Professional ---                       K62.5, Hemorrhage of anus and rectum CPT copyright 2016 American Medical Association. All rights reserved. The codes documented in this report are preliminary and upon coder review may  be revised to meet current compliance requirements. Earline Mayotte, MD 01/18/2016 10:32:57 AM This report has been signed electronically. Number of Addenda: 0 Note Initiated On: 01/18/2016 9:51 AM Scope Withdrawal Time: 0 hours 9 minutes 43 seconds  Total Procedure Duration: 0 hours 35 minutes 28 seconds       [facility]

## 2020-09-13 DIAGNOSIS — E119 Type 2 diabetes mellitus without complications: Secondary | ICD-10-CM | POA: Diagnosis not present

## 2020-09-13 DIAGNOSIS — Z23 Encounter for immunization: Secondary | ICD-10-CM | POA: Diagnosis not present

## 2020-09-13 DIAGNOSIS — Z6824 Body mass index (BMI) 24.0-24.9, adult: Secondary | ICD-10-CM | POA: Diagnosis not present

## 2020-09-13 DIAGNOSIS — F419 Anxiety disorder, unspecified: Secondary | ICD-10-CM | POA: Diagnosis not present

## 2021-01-17 DIAGNOSIS — S83241A Other tear of medial meniscus, current injury, right knee, initial encounter: Secondary | ICD-10-CM | POA: Diagnosis not present

## 2021-02-15 DIAGNOSIS — R6889 Other general symptoms and signs: Secondary | ICD-10-CM | POA: Diagnosis not present

## 2021-02-15 DIAGNOSIS — J329 Chronic sinusitis, unspecified: Secondary | ICD-10-CM | POA: Diagnosis not present

## 2021-03-02 DIAGNOSIS — E119 Type 2 diabetes mellitus without complications: Secondary | ICD-10-CM | POA: Diagnosis not present

## 2021-03-02 DIAGNOSIS — E559 Vitamin D deficiency, unspecified: Secondary | ICD-10-CM | POA: Diagnosis not present

## 2021-03-02 DIAGNOSIS — Z79899 Other long term (current) drug therapy: Secondary | ICD-10-CM | POA: Diagnosis not present

## 2021-03-02 DIAGNOSIS — E785 Hyperlipidemia, unspecified: Secondary | ICD-10-CM | POA: Diagnosis not present

## 2021-03-07 DIAGNOSIS — F419 Anxiety disorder, unspecified: Secondary | ICD-10-CM | POA: Diagnosis not present

## 2021-03-07 DIAGNOSIS — E119 Type 2 diabetes mellitus without complications: Secondary | ICD-10-CM | POA: Diagnosis not present

## 2021-03-07 DIAGNOSIS — Z6826 Body mass index (BMI) 26.0-26.9, adult: Secondary | ICD-10-CM | POA: Diagnosis not present

## 2021-03-07 DIAGNOSIS — F329 Major depressive disorder, single episode, unspecified: Secondary | ICD-10-CM | POA: Diagnosis not present

## 2021-03-10 DIAGNOSIS — N904 Leukoplakia of vulva: Secondary | ICD-10-CM | POA: Insufficient documentation

## 2021-07-14 DIAGNOSIS — E119 Type 2 diabetes mellitus without complications: Secondary | ICD-10-CM | POA: Diagnosis not present

## 2021-07-14 DIAGNOSIS — Z79899 Other long term (current) drug therapy: Secondary | ICD-10-CM | POA: Diagnosis not present

## 2021-07-14 DIAGNOSIS — E785 Hyperlipidemia, unspecified: Secondary | ICD-10-CM | POA: Diagnosis not present

## 2021-07-24 DIAGNOSIS — F329 Major depressive disorder, single episode, unspecified: Secondary | ICD-10-CM | POA: Diagnosis not present

## 2021-07-24 DIAGNOSIS — E119 Type 2 diabetes mellitus without complications: Secondary | ICD-10-CM | POA: Diagnosis not present

## 2021-07-24 DIAGNOSIS — F419 Anxiety disorder, unspecified: Secondary | ICD-10-CM | POA: Diagnosis not present

## 2021-07-24 DIAGNOSIS — Z79899 Other long term (current) drug therapy: Secondary | ICD-10-CM | POA: Diagnosis not present

## 2021-10-03 DIAGNOSIS — S83241D Other tear of medial meniscus, current injury, right knee, subsequent encounter: Secondary | ICD-10-CM | POA: Diagnosis not present

## 2022-01-10 DIAGNOSIS — E785 Hyperlipidemia, unspecified: Secondary | ICD-10-CM | POA: Diagnosis not present

## 2022-01-10 DIAGNOSIS — E119 Type 2 diabetes mellitus without complications: Secondary | ICD-10-CM | POA: Diagnosis not present

## 2022-01-10 DIAGNOSIS — E559 Vitamin D deficiency, unspecified: Secondary | ICD-10-CM | POA: Diagnosis not present

## 2022-01-10 DIAGNOSIS — Z79899 Other long term (current) drug therapy: Secondary | ICD-10-CM | POA: Diagnosis not present

## 2022-01-15 DIAGNOSIS — E119 Type 2 diabetes mellitus without complications: Secondary | ICD-10-CM | POA: Diagnosis not present

## 2022-01-15 DIAGNOSIS — Z79899 Other long term (current) drug therapy: Secondary | ICD-10-CM | POA: Diagnosis not present

## 2022-01-15 DIAGNOSIS — F419 Anxiety disorder, unspecified: Secondary | ICD-10-CM | POA: Diagnosis not present

## 2022-01-15 DIAGNOSIS — F329 Major depressive disorder, single episode, unspecified: Secondary | ICD-10-CM | POA: Diagnosis not present

## 2022-01-30 DIAGNOSIS — M25871 Other specified joint disorders, right ankle and foot: Secondary | ICD-10-CM | POA: Diagnosis not present

## 2022-04-20 DIAGNOSIS — J4522 Mild intermittent asthma with status asthmaticus: Secondary | ICD-10-CM | POA: Diagnosis not present

## 2022-05-15 DIAGNOSIS — R062 Wheezing: Secondary | ICD-10-CM | POA: Diagnosis not present

## 2022-05-15 DIAGNOSIS — J209 Acute bronchitis, unspecified: Secondary | ICD-10-CM | POA: Diagnosis not present

## 2022-05-15 DIAGNOSIS — J9811 Atelectasis: Secondary | ICD-10-CM | POA: Diagnosis not present

## 2022-06-20 NOTE — Progress Notes (Unsigned)
Synopsis: Referred in 06/2022 for wheezing.  Has a history of obstructive sleep apnea, diabetes and hypercholesterolemia.  Subjective:   PATIENT ID: Melinda Phelps GENDER: female DOB: 03-27-1956, MRN: [id number]   HPI  Chief Complaint  Patient presents with   Consult    Referred by PCP for increased wheezing and cough for the past 4 months. Also had an abnormal MRI and CT a few months. Productive cough with green phlegm.     Wheezing: > started 3-4 months ago > started with tightness in her chest, woke up and couldn't breathe > her physician called in prednisone and an inhaler which "did away with it" though she had side effects from the  > she says in the mornings she will cough up some mucus > she has some hoarseness > albuterol inhaler helps her cough up mucus > no dyspnea during the daytime now or back when she was sick > she says is really present when she lay flat only, not any other time > she has a history of bronchitis as a child > was told she had "borderline asthma" as a child > recent spirometry test was normal > hasn't had any more wheezing in the middle of the night since treatment > her main problem now is coughing up mucus > no runny nose or sinus symptoms > no heartburn, indigestion > no dysphagia > has been treated with prednisone x1 and antibiotics (which didn't help) > no new water damage, mold, that she knows of   Has a history of allergies: grass, trees, cats, dogs > used to be on allergy shots over 20 years ago > uses claritin prn which helps  OSA > she had a sleep study which was positive 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  Treated with azithromycin  Past Medical History:   Diagnosis Date   Chest discomfort    Colon polyp 2010?   Depression    Diabetes mellitus    Hyperlipidemia    Sleep apnea      Family History  Problem Relation Age of Onset   Heart failure Father    Heart attack Brother    Cancer Mother 75       lung     Social History   Socioeconomic History   Marital status: Widowed    Spouse name: Not on file   Number of children: Not on file   Years of education: Not on file   Highest education level: Not on file  Occupational History    Employer: [employer]  Tobacco Use   Smoking status: Never   Smokeless tobacco: Not on file  Substance and Sexual Activity   Alcohol use: No    Alcohol/week: 0.0 standard drinks of alcohol   Drug use: No   Sexual activity: Not on file  Other Topics Concern   Not on file  Social History Narrative   No regular exercise   Social Determinants of Health   Financial Resource Strain: Not on file  Food Insecurity: Not on file  Transportation Needs: Not on file  Physical Activity: Not on file  Stress: Not on file  Social Connections: Not on file  Intimate Partner Violence: Not on file  No Known Allergies   Outpatient Medications Prior to Visit  Medication Sig Dispense Refill   atorvastatin (LIPITOR) 40 MG tablet Take 40 mg by mouth daily.       clobetasol cream (TEMOVATE) 0.05 % Apply 1 application topically as directed.     desloratadine (CLARINEX) 5 MG tablet Take 5 mg by mouth daily.       enalapril (VASOTEC) 10 MG tablet Take 10 mg by mouth daily.       escitalopram (LEXAPRO) 20 MG tablet Take 20 mg by mouth daily.       Estradiol-Estriol-Progesterone (BIEST/PROGESTERONE) CREA Place onto the skin as directed.     estrogens conjugated, synthetic A, (CENESTIN) 0.45 MG tablet Take 0.45 mg by mouth daily.       fish oil-omega-3 fatty acids 1000 MG capsule Take 1 capsule by mouth daily.       metFORMIN (GLUCOPHAGE) 1000 MG tablet Take 1,000 mg by mouth 2 (two) times daily with a meal.      Multiple Vitamin (MULTIVITAMIN) tablet Take 1 tablet by mouth daily.       No facility-administered medications prior to visit.    Review of Systems  Constitutional:  Negative for chills, fever, malaise/fatigue and weight loss.  HENT:  Negative for congestion, nosebleeds, sinus pain and sore throat.   Eyes:  Negative for photophobia, pain and discharge.  Respiratory:  Positive for cough, sputum production and wheezing. Negative for hemoptysis and shortness of breath.   Cardiovascular:  Negative for chest pain, palpitations, orthopnea and leg swelling.  Gastrointestinal:  Negative for abdominal pain, constipation, diarrhea, nausea and vomiting.  Genitourinary:  Negative for dysuria, frequency, hematuria and urgency.  Musculoskeletal:  Negative for back pain, joint pain, myalgias and neck pain.  Skin:  Negative for itching and rash.  Neurological:  Negative for tingling, tremors, sensory change, speech change, focal weakness, seizures, weakness and headaches.  Psychiatric/Behavioral:  Negative for memory loss, substance abuse and suicidal ideas. The patient is not nervous/anxious.       Objective:  Physical Exam   Vitals:   06/21/22 0949  BP: 118/72  Pulse: 77  SpO2: 99%  Weight: 156 lb 3.2 oz (70.9 kg)  Height: 5\' 7"  (1.702 m)    Gen: well appearing, no acute distress HENT: NCAT, OP clear, neck supple without masses Eyes: PERRL, EOMi Lymph: no cervical lymphadenopathy PULM: CTA B CV: RRR, no mgr, no JVD GI: BS+, soft, nontender, no hsm Derm: no rash or skin breakdown MSK: normal bulk and tone Neuro: A&Ox4, CN II-XII intact, strength 5/5 in all 4 extremities Psyche: normal mood and affect   CBC    Component Value Date/Time   WBC 14.6 (H) 12/16/2007 0800   RBC 4.72 12/16/2007 0800   HGB 14.6 12/16/2007 0800   HCT 41.9 12/16/2007 0800   PLT 329 12/16/2007 0800   MCV 88.8 12/16/2007 0800   MCHC 34.8 12/16/2007 0800   RDW 13.7 12/16/2007 0800   LYMPHSABS 1.0  12/16/2007 0800   MONOABS 0.2 12/16/2007 0800   EOSABS 0.0 12/16/2007 0800   BASOSABS 0.0 12/16/2007 0800     Chest imaging: May 15, 2022 chest x-ray showed left basilar atelectasis May 30, 2022 CT chest showed no acute intrathoracic findings, minimal basilar atelectasis bilaterally, ascending thoracic aortic aneurysm measuring at 4.2 cm  PFT: 06/21/2022 06/23/2022 > normal 06/21/2022 FENO> 46 ppm  Labs:  Path:  Echo:  Heart Catheterization:       Assessment & Plan:   Shortness of  breath - Plan: Spirometry with graph, Nitric oxide, CBC w/Diff, IgE  Wheezing  Allergic rhinitis due to animal hair and dander  Discussion: Pleasant 66 year old female with a history of allergic rhinitis and a family history of asthma presents with nocturnal wheezing and some morning mucus production with normal chest imaging.  I explained to her that the differential diagnosis includes adult onset asthma, gastroesophageal reflux disease mediated laryngeal irritation, or possibly obstructive sleep apnea.  In her particular case and most worried about asthma.  Plan: Ascending thoracic aortic aneurysm: You need to have a CT scan of your chest annually to monitor this  Nocturnal wheezing: Today in the office we will assess for asthma: Spirometry testing (normal) Exhaled nitric oxide testing (borderline) Complete blood count with differential Serum IgE Keep using albuterol as needed as you are doing  Because symptoms seem to have improved we will continue to monitor for now without changing therapy  I would like for you to come back in 6 weeks: If there is no evidence of asthma on the testing that we have done and if you are still having symptoms at that time then we will consider esophageal manometry and polysomnogram to investigate your esophagus for reflux and for sleep apnea respectively.    Current Outpatient Medications:    atorvastatin (LIPITOR) 40 MG tablet, Take 40 mg by mouth daily.   , Disp: , Rfl:    clobetasol cream (TEMOVATE) 0.05 %, Apply 1 application topically as directed., Disp: , Rfl:    desloratadine (CLARINEX) 5 MG tablet, Take 5 mg by mouth daily.  , Disp: , Rfl:    enalapril (VASOTEC) 10 MG tablet, Take 10 mg by mouth daily.  , Disp: , Rfl:    escitalopram (LEXAPRO) 20 MG tablet, Take 20 mg by mouth daily.  , Disp: , Rfl:    Estradiol-Estriol-Progesterone (BIEST/PROGESTERONE) CREA, Place onto the skin as directed., Disp: , Rfl:    estrogens conjugated, synthetic A, (CENESTIN) 0.45 MG tablet, Take 0.45 mg by mouth daily.  , Disp: , Rfl:    fish oil-omega-3 fatty acids 1000 MG capsule, Take 1 capsule by mouth daily.  , Disp: , Rfl:    metFORMIN (GLUCOPHAGE) 1000 MG tablet, Take 1,000 mg by mouth 2 (two) times daily with a meal., Disp: , Rfl:    Multiple Vitamin (MULTIVITAMIN) tablet, Take 1 tablet by mouth daily.  , Disp: , Rfl:

## 2022-06-21 ENCOUNTER — Ambulatory Visit: Payer: Federal, State, Local not specified - PPO | Admitting: Pulmonary Disease

## 2022-06-21 ENCOUNTER — Encounter: Payer: Self-pay | Admitting: Pulmonary Disease

## 2022-06-21 VITALS — BP 118/72 | HR 77 | Ht 67.0 in | Wt 156.2 lb

## 2022-06-21 DIAGNOSIS — R0602 Shortness of breath: Secondary | ICD-10-CM

## 2022-06-21 DIAGNOSIS — R062 Wheezing: Secondary | ICD-10-CM

## 2022-06-21 DIAGNOSIS — J3081 Allergic rhinitis due to animal (cat) (dog) hair and dander: Secondary | ICD-10-CM | POA: Diagnosis not present

## 2022-06-21 LAB — CBC WITH DIFFERENTIAL/PLATELET
Basophils Absolute: 0.1 10*3/uL (ref 0.0–0.1)
Basophils Relative: 1.5 % (ref 0.0–3.0)
Eosinophils Absolute: 1.4 10*3/uL — ABNORMAL HIGH (ref 0.0–0.7)
Eosinophils Relative: 19.3 % — ABNORMAL HIGH (ref 0.0–5.0)
HCT: 40.9 % (ref 36.0–46.0)
Hemoglobin: 13.9 g/dL (ref 12.0–15.0)
Lymphocytes Relative: 29.3 % (ref 12.0–46.0)
Lymphs Abs: 2.1 10*3/uL (ref 0.7–4.0)
MCHC: 33.8 g/dL (ref 30.0–36.0)
MCV: 91.8 fl (ref 78.0–100.0)
Monocytes Absolute: 0.5 10*3/uL (ref 0.1–1.0)
Monocytes Relative: 6.7 % (ref 3.0–12.0)
Neutro Abs: 3.1 10*3/uL (ref 1.4–7.7)
Neutrophils Relative %: 43.2 % (ref 43.0–77.0)
Platelets: 324 10*3/uL (ref 150.0–400.0)
RBC: 4.46 Mil/uL (ref 3.87–5.11)
RDW: 14 % (ref 11.5–15.5)
WBC: 7.2 10*3/uL (ref 4.0–10.5)

## 2022-06-21 LAB — NITRIC OXIDE: Nitric Oxide: 46

## 2022-06-21 NOTE — Patient Instructions (Addendum)
Ascending thoracic aortic aneurysm: You need to have a CT scan of your chest annually to monitor this  Nocturnal wheezing: Today in the office we will assess for asthma:  Spirometry testing (normal) Exhaled nitric oxide testing (borderline) Complete blood count with differential Serum IgE  Because symptoms seem to have improved we will continue to monitor for now  I would like for you to come back in 6 weeks: If there is no evidence of asthma on the testing that we have done and if you are still having symptoms at that time then we will consider esophageal manometry and polysomnogram to investigate your esophagus for reflux and for sleep apnea respectively.

## 2022-06-22 LAB — IGE: IgE (Immunoglobulin E), Serum: 1812 kU/L — ABNORMAL HIGH (ref <=114)

## 2022-06-26 DIAGNOSIS — M7989 Other specified soft tissue disorders: Secondary | ICD-10-CM | POA: Diagnosis not present

## 2022-06-26 DIAGNOSIS — M25571 Pain in right ankle and joints of right foot: Secondary | ICD-10-CM | POA: Diagnosis not present

## 2022-06-26 DIAGNOSIS — M7661 Achilles tendinitis, right leg: Secondary | ICD-10-CM | POA: Diagnosis not present

## 2022-07-24 DIAGNOSIS — Z79899 Other long term (current) drug therapy: Secondary | ICD-10-CM | POA: Diagnosis not present

## 2022-07-24 DIAGNOSIS — Z9889 Other specified postprocedural states: Secondary | ICD-10-CM | POA: Diagnosis not present

## 2022-07-24 DIAGNOSIS — E119 Type 2 diabetes mellitus without complications: Secondary | ICD-10-CM | POA: Diagnosis not present

## 2022-07-24 DIAGNOSIS — E785 Hyperlipidemia, unspecified: Secondary | ICD-10-CM | POA: Diagnosis not present

## 2022-07-30 DIAGNOSIS — F329 Major depressive disorder, single episode, unspecified: Secondary | ICD-10-CM | POA: Diagnosis not present

## 2022-07-30 DIAGNOSIS — Z79899 Other long term (current) drug therapy: Secondary | ICD-10-CM | POA: Diagnosis not present

## 2022-07-30 DIAGNOSIS — E119 Type 2 diabetes mellitus without complications: Secondary | ICD-10-CM | POA: Diagnosis not present

## 2022-07-30 DIAGNOSIS — F419 Anxiety disorder, unspecified: Secondary | ICD-10-CM | POA: Diagnosis not present

## 2022-08-10 NOTE — Progress Notes (Signed)
Synopsis: Referred in 06/2022 for wheezing.  Has a history of obstructive sleep apnea, diabetes and hypercholesterolemia. Had a lot of bronchitis when she was a kid.   Subjective:   PATIENT ID: Melinda Phelps GENDER: female DOB: 11-Jan-1956, MRN: [id number]   HPI  Chief Complaint  Patient presents with   Follow-up    Sob has gotten better Flu vaccine today Questions about rsv vaccine    Shaka says that in general she is doing great. Hasn't had to use her albuterol except one time since the last visit. Noted that she had a lot of bronchitis as a kid, never hospitalized She notes that when she goes and visits her son she will have increasing sinus symptoms and congestion.  She uses allergy pills for this.  Specifically she is using Clarinex.  She also says that sometimes when she is around her son's house (they have a cat) she has to use this more frequently.  Serum IgE and eosinophils were high   Past Medical History:  Diagnosis Date   Chest discomfort    Colon polyp 2010?   Depression    Diabetes mellitus    Hyperlipidemia    Sleep apnea        Review of Systems  Constitutional:  Negative for chills, diaphoresis, malaise/fatigue and weight loss.  HENT:  Positive for congestion. Negative for ear pain and hearing loss.   Respiratory:  Negative for cough, sputum production and wheezing.   Cardiovascular:  Negative for chest pain and claudication.      Objective:  Physical Exam   Vitals:   08/14/22 1007  BP: 104/70  Pulse: 80  Temp: 97.9 F (36.6 C)  TempSrc: Oral  SpO2: 99%  Weight: 157 lb 9.6 oz (71.5 kg)  Height: 5\' 7"  (1.702 m)   General:  Resting comfortably in bed HENT: NCAT OP clear PULM: CTA B, normal effort CV: RRR, no mgr GI: BS+, soft, nontender MSK: normal bulk and tone Neuro: awake, alert, no distress, MAEW    CBC    Component Value Date/Time   WBC 7.2 06/21/2022 1034   RBC 4.46 06/21/2022 1034   HGB 13.9 06/21/2022 1034   HCT  40.9 06/21/2022 1034   PLT 324.0 06/21/2022 1034   MCV 91.8 06/21/2022 1034   MCHC 33.8 06/21/2022 1034   RDW 14.0 06/21/2022 1034   LYMPHSABS 2.1 06/21/2022 1034   MONOABS 0.5 06/21/2022 1034   EOSABS 1.4 (H) 06/21/2022 1034   BASOSABS 0.1 06/21/2022 1034     Chest imaging: May 15, 2022 chest x-ray showed left basilar atelectasis May 30, 2022 CT chest showed no acute intrathoracic findings, minimal basilar atelectasis bilaterally, ascending thoracic aortic aneurysm measuring at 4.2 cm, radiology interpretation notes mild bronchiectasis, images independently reviewed, very mild bronchiectasis in the right middle lobe  PFT: 06/21/2022 Spiro > normal 06/21/2022 FENO> 46 ppm  Labs: 06/2022 IgE 1814 06/2022 Eosinophils 1,400 cell/dL  Path:  Echo:  Heart Catheterization:       Assessment & Plan:   Need for immunization against influenza - Plan: Flu Vaccine QUAD 63mo+IM (Fluarix, Fluzone & Alfiuria Quad PF)  Seasonal allergic rhinitis due to pollen  Eosinophilia, unspecified type  Discussion: Despite having a remarkably elevated serum IgE and eosinophil count Analeigha has essentially 0 symptoms.  So there is really no indication for me to treat her with any sort of controlled inhaled medicines or other therapies.  However, if she were develop increasing chest tightness, wheezing or shortness of breath  or other symptoms then we could start an inhaled corticosteroid.  She does have allergic rhinitis.  Plan: Allergic rhinitis: Keep taking Claritin as you are doing If and when your symptoms are worse I recommend fluticasone nose spray 2 sprays each nostril daily, keep in mind it takes up to a week for this medicine to start working.  Elevated serum eosinophil and serum IgE: These are lab test that are typically elevated with folks who have allergy symptoms. Despite the fact that you have very mild bronchiectasis on the CT scan of your chest right now I see no evidence of an  underlying lung disease. You may at best to have mild intermittent asthma. However, if you develop worsening chest tightness, wheezing or shortness of breath please come back to see me so that we can manage this further  Flu shot today  We will see you back if symptoms develop or worsen.  Immunization History  Administered Date(s) Administered   Influenza,inj,Quad PF,6+ Mos 08/14/2022       Current Outpatient Medications:    albuterol (VENTOLIN HFA) 108 (90 Base) MCG/ACT inhaler, Inhale into the lungs every 6 (six) hours as needed for wheezing or shortness of breath., Disp: , Rfl:    atorvastatin (LIPITOR) 40 MG tablet, Take 40 mg by mouth daily.  , Disp: , Rfl:    clobetasol cream (TEMOVATE) 0.05 %, Apply 1 application topically as directed., Disp: , Rfl:    desloratadine (CLARINEX) 5 MG tablet, Take 5 mg by mouth daily.  , Disp: , Rfl:    escitalopram (LEXAPRO) 20 MG tablet, Take 20 mg by mouth daily.  , Disp: , Rfl:    fish oil-omega-3 fatty acids 1000 MG capsule, Take 1 capsule by mouth daily.  , Disp: , Rfl:    metFORMIN (GLUCOPHAGE) 500 MG tablet, Take 500 mg by mouth 2 (two) times daily with a meal., Disp: , Rfl:    Multiple Vitamin (MULTIVITAMIN) tablet, Take 1 tablet by mouth daily.  , Disp: , Rfl:    Semaglutide,0.25 or 0.5MG /DOS, (OZEMPIC, 0.25 OR 0.5 MG/DOSE,) 2 MG/1.5ML SOPN, INJECT 0.5MG  S.Q. ONCE A WEEK. Subcutaneous as directed for 90 days, Disp: , Rfl:    Estradiol-Estriol-Progesterone (BIEST/PROGESTERONE) CREA, Place onto the skin as directed., Disp: , Rfl:    estrogens conjugated, synthetic A, (CENESTIN) 0.45 MG tablet, Take 0.45 mg by mouth daily.  , Disp: , Rfl:

## 2022-08-14 ENCOUNTER — Ambulatory Visit: Payer: Federal, State, Local not specified - PPO | Admitting: Pulmonary Disease

## 2022-08-14 ENCOUNTER — Encounter: Payer: Self-pay | Admitting: Pulmonary Disease

## 2022-08-14 VITALS — BP 104/70 | HR 80 | Temp 97.9°F | Ht 67.0 in | Wt 157.6 lb

## 2022-08-14 DIAGNOSIS — Z23 Encounter for immunization: Secondary | ICD-10-CM | POA: Diagnosis not present

## 2022-08-14 DIAGNOSIS — J301 Allergic rhinitis due to pollen: Secondary | ICD-10-CM

## 2022-08-14 DIAGNOSIS — J4540 Moderate persistent asthma, uncomplicated: Secondary | ICD-10-CM

## 2022-08-14 DIAGNOSIS — D721 Eosinophilia, unspecified: Secondary | ICD-10-CM | POA: Diagnosis not present

## 2022-08-14 NOTE — Patient Instructions (Signed)
Allergic rhinitis: Keep taking Claritin as you are doing If and when your symptoms are worse I recommend fluticasone nose spray 2 sprays each nostril daily, keep in mind it takes up to a week for this medicine to start working.  Elevated serum eosinophil and serum IgE: These are lab test that are typically elevated with folks who have allergy symptoms. Despite the fact that you have very mild bronchiectasis on the CT scan of your chest right now I see no evidence of an underlying lung disease. You may at best to have mild intermittent asthma. However, if you develop worsening chest tightness, wheezing or shortness of breath please come back to see me so that we can manage this further  Flu shot today  We will see you back if symptoms develop or worsen.

## 2022-08-20 ENCOUNTER — Ambulatory Visit: Payer: Federal, State, Local not specified - PPO | Attending: Cardiology | Admitting: Cardiology

## 2022-08-20 VITALS — BP 112/62 | HR 66 | Ht 67.0 in | Wt 157.4 lb

## 2022-08-20 DIAGNOSIS — I7121 Aneurysm of the ascending aorta, without rupture: Secondary | ICD-10-CM

## 2022-08-20 DIAGNOSIS — I2510 Atherosclerotic heart disease of native coronary artery without angina pectoris: Secondary | ICD-10-CM | POA: Diagnosis not present

## 2022-08-20 DIAGNOSIS — E782 Mixed hyperlipidemia: Secondary | ICD-10-CM

## 2022-08-20 DIAGNOSIS — E118 Type 2 diabetes mellitus with unspecified complications: Secondary | ICD-10-CM

## 2022-08-20 DIAGNOSIS — I7090 Unspecified atherosclerosis: Secondary | ICD-10-CM

## 2022-08-20 NOTE — Progress Notes (Signed)
Cardiology Office Note:    Date:  08/20/2022   ID:  Melinda Phelps, DOB 1956/02/20, MRN [id number]  PCP:  Erasmo Downer, NP  Cardiologist:  Thomasene Ripple, DO  Electrophysiologist:  None   Referring MD: Erasmo Downer, NP   " I am doing ok"   History of Present Illness:    Melinda Phelps is a 66 y.o. female with a hx of type 2 diabetes, hypertension, hyperlipidemia, family history of premature coronary artery disease with her father first MI in his 52s, 2 brothers with MIs in her 57 which resulted to dad and 1 brother, recently diagnosed ascending aortic aneurysm, sleep apnea, allergy rhinitis follows with pulmonary.  The patient was referred to cardiology given her recent finding of ascending aortic aneurysm.  She offers no complaints.  She just wants to be proactive with preventative measures.  Past Medical History:  Diagnosis Date   Chest discomfort    Colon polyp 2010?   Depression    Diabetes mellitus    Hyperlipidemia    Sleep apnea     Past Surgical History:  Procedure Laterality Date   CESAREAN SECTION  1986, 1991, 1997   COLONOSCOPY  2010?   Dr Calvert Cantor in [place]   COLONOSCOPY WITH PROPOFOL N/A 01/18/2016   Procedure: COLONOSCOPY WITH PROPOFOL;  Surgeon: Earline Mayotte, MD;  Location: [facility] ENDOSCOPY;  Service: Endoscopy;  Laterality: N/A;   EXPLORATORY LAPAROTOMY  1986   HERNIA REPAIR  2004   LAPAROSCOPIC GASTRIC SLEEVE RESECTION  2012   Dr Smitty Cords   TONSILLECTOMY  1964   TOTAL ABDOMINAL HYSTERECTOMY W/ BILATERAL SALPINGOOPHORECTOMY  2003    Current Medications: Current Meds  Medication Sig   albuterol (VENTOLIN HFA) 108 (90 Base) MCG/ACT inhaler Inhale into the lungs every 6 (six) hours as needed for wheezing or shortness of breath.   atorvastatin (LIPITOR) 40 MG tablet Take 40 mg by mouth daily.     Biotin 5000 MCG CAPS Take 5,000 mcg by mouth daily.   clobetasol cream (TEMOVATE) 0.05 % Apply 1 application topically as directed.   Coenzyme  Q10 (CO Q 10 PO) Take by mouth.   desloratadine (CLARINEX) 5 MG tablet Take 5 mg by mouth daily.     escitalopram (LEXAPRO) 20 MG tablet Take 20 mg by mouth daily.     fish oil-omega-3 fatty acids 1000 MG capsule Take 1 capsule by mouth daily.     Magnesium 250 MG TABS Take 250 mg by mouth daily.   metFORMIN (GLUCOPHAGE) 500 MG tablet Take 500 mg by mouth 2 (two) times daily with a meal.   Multiple Vitamin (MULTIVITAMIN) tablet Take 1 tablet by mouth daily.     Semaglutide,0.25 or 0.5MG /DOS, (OZEMPIC, 0.25 OR 0.5 MG/DOSE,) 2 MG/1.5ML SOPN INJECT 0.5MG  S.Q. ONCE A WEEK. Subcutaneous as directed for 90 days     Allergies:   Patient has no known allergies.   Social History   Socioeconomic History   Marital status: Widowed    Spouse name: Not on file   Number of children: Not on file   Years of education: Not on file   Highest education level: Not on file  Occupational History    Employer: [employer]  Tobacco Use   Smoking status: Never   Smokeless tobacco: Not on file  Substance and Sexual Activity   Alcohol use: No    Alcohol/week: 0.0 standard drinks of alcohol   Drug use: No   Sexual activity: Not on file  Other Topics Concern   Not on file  Social History Narrative   No regular exercise   Social Determinants of Health   Financial Resource Strain: Not on file  Food Insecurity: Not on file  Transportation Needs: Not on file  Physical Activity: Not on file  Stress: Not on file  Social Connections: Not on file     Family History: The patient's family history includes Cancer (age of onset: 18) in her mother; Heart attack in her brother; Heart failure in her father.  ROS:   Review of Systems  Constitution: Negative for decreased appetite, fever and weight gain.  HENT: Negative for congestion, ear discharge, hoarse voice and sore throat.   Eyes: Negative for discharge, redness, vision loss in right eye and visual halos.  Cardiovascular: Negative for chest pain,  dyspnea on exertion, leg swelling, orthopnea and palpitations.  Respiratory: Negative for cough, hemoptysis, shortness of breath and snoring.   Endocrine: Negative for heat intolerance and polyphagia.  Hematologic/Lymphatic: Negative for bleeding problem. Does not bruise/bleed easily.  Skin: Negative for flushing, nail changes, rash and suspicious lesions.  Musculoskeletal: Negative for arthritis, joint pain, muscle cramps, myalgias, neck pain and stiffness.  Gastrointestinal: Negative for abdominal pain, bowel incontinence, diarrhea and excessive appetite.  Genitourinary: Negative for decreased libido, genital sores and incomplete emptying.  Neurological: Negative for brief paralysis, focal weakness, headaches and loss of balance.  Psychiatric/Behavioral: Negative for altered mental status, depression and suicidal ideas.  Allergic/Immunologic: Negative for HIV exposure and persistent infections.    EKGs/Labs/Other Studies Reviewed:    The following studies were reviewed today:   EKG:  The ekg ordered today demonstrates sinus rhythm, heart rate 66 bpm.  Recent Labs: 06/21/2022: Hemoglobin 13.9; Platelets 324.0  Recent Lipid Panel No results found for: "CHOL", "TRIG", "HDL", "CHOLHDL", "VLDL", "LDLCALC", "LDLDIRECT"  Physical Exam:    VS:  BP 112/62 (BP Location: Left Arm, Patient Position: Sitting, Cuff Size: Normal)   Pulse 66   Ht 5\' 7"  (1.702 m)   Wt 157 lb 6.4 oz (71.4 kg)   SpO2 98%   BMI 24.65 kg/m     Wt Readings from Last 3 Encounters:  08/20/22 157 lb 6.4 oz (71.4 kg)  08/14/22 157 lb 9.6 oz (71.5 kg)  06/21/22 156 lb 3.2 oz (70.9 kg)     GEN: Well nourished, well developed in no acute distress HEENT: Normal NECK: No JVD; No carotid bruits LYMPHATICS: No lymphadenopathy CARDIAC: S1S2 noted,RRR, no murmurs, rubs, gallops RESPIRATORY:  Clear to auscultation without rales, wheezing or rhonchi  ABDOMEN: Soft, non-tender, non-distended, +bowel sounds, no  guarding. EXTREMITIES: No edema, No cyanosis, no clubbing MUSCULOSKELETAL:  No deformity  SKIN: Warm and dry NEUROLOGIC:  Alert and oriented x 3, non-focal PSYCHIATRIC:  Normal affect, good insight  ASSESSMENT:    1. Aneurysm of ascending aorta without rupture (HCC)   2. Atherosclerosis   3. Type 2 diabetes mellitus with complication, without long-term current use of insulin (HCC)   4. Mixed hyperlipidemia   5. ASCVD (arteriosclerotic cardiovascular disease)    PLAN:     Thoracic ascending aneurysm-recently diagnosed on scan from July 2023 show 4.2 cm.  We will plan to repeat testing in 9 months which will be 12 months from date of initial scan.  She has significant risk factor for coronary artery disease she has not been screened, I think she would be a great candidate to get a coronary calcium score on.  We will plan for that today.  She  is agreeable to get this testing done.  She understands that this test is a self-pay.  Diabetes mellitus-this is being managed by her primary provider.  Hyperlipidemia-she is on atorvastatin.  We will get a request her recent lipid profile for my review.  The patient is in agreement with the above plan. The patient left the office in stable condition.  The patient will follow up in 12 months   Medication Adjustments/Labs and Tests Ordered: Current medicines are reviewed at length with the patient today.  Concerns regarding medicines are outlined above.  Orders Placed This Encounter  Procedures   CT ANGIO CHEST AORTA W/CM & OR WO/CM   CT CARDIAC SCORING (SELF PAY ONLY)   No orders of the defined types were placed in this encounter.   Patient Instructions  Medication Instructions:  Your physician recommends that you continue on your current medications as directed. Please refer to the Current Medication list given to you today.  *If you need a refill on your cardiac medications before your next appointment, please call your  pharmacy*   Lab Work: NONE ordered at this time of appointment  If you have labs (blood work) drawn today and your tests are completely normal, you will receive your results only by: [place] (if you have MyChart) OR A paper copy in the mail If you have any lab test that is abnormal or we need to change your treatment, we will call you to review the results.   Testing/Procedures: Your physician recommends that you have an CTA of your Chest/Aorta. This will take place at [facility]  ([address]  ). Someone from their office will give you a call to get you scheduled.   Dr.Ary Lavine has ordered a CT coronary calcium score.   Test locations:  [facility]   This is $99 out of pocket.   Coronary CalciumScan A coronary calcium scan is an imaging test used to look for deposits of calcium and other fatty materials (plaques) in the inner lining of the blood vessels of the heart (coronary arteries). These deposits of calcium and plaques can partly clog and narrow the coronary arteries without producing any symptoms or warning signs. This puts a person at risk for a heart attack. This test can detect these deposits before symptoms develop. Tell a health care provider about: Any allergies you have. All medicines you are taking, including vitamins, herbs, eye drops, creams, and over-the-counter medicines. Any problems you or family members have had with anesthetic medicines. Any blood disorders you have. Any surgeries you have had. Any medical conditions you have. Whether you are pregnant or may be pregnant. What are the risks? Generally, this is a safe procedure. However, problems may occur, including: Harm to a pregnant woman and her unborn baby. This test involves the use of radiation. Radiation exposure can be dangerous to a pregnant woman and her unborn baby. If you are pregnant, you generally should not have this procedure done. Slight  increase in the risk of cancer. This is because of the radiation involved in the test. What happens before the procedure? No preparation is needed for this procedure. What happens during the procedure? You will undress and remove any jewelry around your neck or chest. You will put on a hospital gown. Sticky electrodes will be placed on your chest. The electrodes will be connected to an electrocardiogram (ECG) machine to record a tracing of the electrical activity of your heart. A CT scanner  will take pictures of your heart. During this time, you will be asked to lie still and hold your breath for 2-3 seconds while a picture of your heart is being taken. The procedure may vary among health care providers and hospitals. What happens after the procedure? You can get dressed. You can return to your normal activities. It is up to you to get the results of your test. Ask your health care provider, or the department that is doing the test, when your results will be ready. Summary A coronary calcium scan is an imaging test used to look for deposits of calcium and other fatty materials (plaques) in the inner lining of the blood vessels of the heart (coronary arteries). Generally, this is a safe procedure. Tell your health care provider if you are pregnant or may be pregnant. No preparation is needed for this procedure. A CT scanner will take pictures of your heart. You can return to your normal activities after the scan is done. This information is not intended to replace advice given to you by your health care provider. Make sure you discuss any questions you have with your health care provider. Document Released: 04/19/2008 Document Revised: 09/10/2016 Document Reviewed: 09/10/2016 Elsevier Interactive Patient Education  2017 [employer]    Follow-Up: At [facility], you and your health needs are our priority.  As part of our continuing mission to provide you with exceptional heart  care, we have created designated Provider Care Teams.  These Care Teams include your primary Cardiologist (physician) and Advanced Practice Providers (APPs -  Physician Assistants and Nurse Practitioners) who all work together to provide you with the care you need, when you need it.  We recommend signing up for the patient portal called "MyChart".  Sign up information is provided on this After Visit Summary.  MyChart is used to connect with patients for Virtual Visits (Telemedicine).  Patients are able to view lab/test results, encounter notes, upcoming appointments, etc.  Non-urgent messages can be sent to your provider as well.   To learn more about what you can do with MyChart, go to [email or web address].    Your next appointment:   1 year(s)  The format for your next appointment:   In Person  Provider:   Thomasene RippleKardie Dayvian Blixt, DO      Adopting a Healthy Lifestyle.  Know what a healthy weight is for you (roughly BMI <25) and aim to maintain this   Aim for 7+ servings of fruits and vegetables daily   65-80+ fluid ounces of water or unsweet tea for healthy kidneys   Limit to max 1 drink of alcohol per day; avoid smoking/tobacco   Limit animal fats in diet for cholesterol and heart health - choose grass fed whenever available   Avoid highly processed foods, and foods high in saturated/trans fats   Aim for low stress - take time to unwind and care for your mental health   Aim for 150 min of moderate intensity exercise weekly for heart health, and weights twice weekly for bone health   Aim for 7-9 hours of sleep daily   When it comes to diets, agreement about the perfect plan isnt easy to find, even among the experts. Experts at the [employer] developed an idea known as the Healthy Eating Plate. Just imagine a plate divided into logical, healthy portions.   The emphasis is on diet quality:   Load up on vegetables and fruits - one-half of your plate: Aim  for color  and variety, and remember that potatoes dont count.   Go for whole grains - one-quarter of your plate: Whole wheat, barley, wheat berries, quinoa, oats, brown rice, and foods made with them. If you want pasta, go with whole wheat pasta.   Protein power - one-quarter of your plate: Fish, chicken, beans, and nuts are all healthy, versatile protein sources. Limit red meat.   The diet, however, does go beyond the plate, offering a few other suggestions.   Use healthy plant oils, such as olive, canola, soy, corn, sunflower and peanut. Check the labels, and avoid partially hydrogenated oil, which have unhealthy trans fats.   If youre thirsty, drink water. Coffee and tea are good in moderation, but skip sugary drinks and limit milk and dairy products to one or two daily servings.   The type of carbohydrate in the diet is more important than the amount. Some sources of carbohydrates, such as vegetables, fruits, whole grains, and beans-are healthier than others.   Finally, stay active  Signed, Thomasene Ripple, DO  08/20/2022 9:04 AM    [facility]

## 2022-08-20 NOTE — Patient Instructions (Signed)
Medication Instructions:  Your physician recommends that you continue on your current medications as directed. Please refer to the Current Medication list given to you today.  *If you need a refill on your cardiac medications before your next appointment, please call your pharmacy*   Lab Work: NONE ordered at this time of appointment  If you have labs (blood work) drawn today and your tests are completely normal, you will receive your results only by: MyChart Message (if you have MyChart) OR A paper copy in the mail If you have any lab test that is abnormal or we need to change your treatment, we will call you to review the results.   Testing/Procedures: Your physician recommends that you have an CTA of your Chest/Aorta. This will take place at [facility]  ([address]  ). Someone from their office will give you a call to get you scheduled.   Dr.Tobb has ordered a CT coronary calcium score.   Test locations:  [facility]   This is $99 out of pocket.   Coronary CalciumScan A coronary calcium scan is an imaging test used to look for deposits of calcium and other fatty materials (plaques) in the inner lining of the blood vessels of the heart (coronary arteries). These deposits of calcium and plaques can partly clog and narrow the coronary arteries without producing any symptoms or warning signs. This puts a person at risk for a heart attack. This test can detect these deposits before symptoms develop. Tell a health care provider about: Any allergies you have. All medicines you are taking, including vitamins, herbs, eye drops, creams, and over-the-counter medicines. Any problems you or family members have had with anesthetic medicines. Any blood disorders you have. Any surgeries you have had. Any medical conditions you have. Whether you are pregnant or may be pregnant. What are the risks? Generally, this is a safe procedure. However,  problems may occur, including: Harm to a pregnant woman and her unborn baby. This test involves the use of radiation. Radiation exposure can be dangerous to a pregnant woman and her unborn baby. If you are pregnant, you generally should not have this procedure done. Slight increase in the risk of cancer. This is because of the radiation involved in the test. What happens before the procedure? No preparation is needed for this procedure. What happens during the procedure? You will undress and remove any jewelry around your neck or chest. You will put on a hospital gown. Sticky electrodes will be placed on your chest. The electrodes will be connected to an electrocardiogram (ECG) machine to record a tracing of the electrical activity of your heart. A CT scanner will take pictures of your heart. During this time, you will be asked to lie still and hold your breath for 2-3 seconds while a picture of your heart is being taken. The procedure may vary among health care providers and hospitals. What happens after the procedure? You can get dressed. You can return to your normal activities. It is up to you to get the results of your test. Ask your health care provider, or the department that is doing the test, when your results will be ready. Summary A coronary calcium scan is an imaging test used to look for deposits of calcium and other fatty materials (plaques) in the inner lining of the blood vessels of the heart (coronary arteries). Generally, this is a safe procedure. Tell your health care provider if you are pregnant or  may be pregnant. No preparation is needed for this procedure. A CT scanner will take pictures of your heart. You can return to your normal activities after the scan is done. This information is not intended to replace advice given to you by your health care provider. Make sure you discuss any questions you have with your health care provider. Document Released: 04/19/2008 Document  Revised: 09/10/2016 Document Reviewed: 09/10/2016 Elsevier Interactive Patient Education  2017 [place]: At [facility], you and your health needs are our priority.  As part of our continuing mission to provide you with exceptional heart care, we have created designated Provider Care Teams.  These Care Teams include your primary Cardiologist (physician) and Advanced Practice Providers (APPs -  Physician Assistants and Nurse Practitioners) who all work together to provide you with the care you need, when you need it.  We recommend signing up for the patient portal called "MyChart".  Sign up information is provided on this After Visit Summary.  MyChart is used to connect with patients for Virtual Visits (Telemedicine).  Patients are able to view lab/test results, encounter notes, upcoming appointments, etc.  Non-urgent messages can be sent to your provider as well.   To learn more about what you can do with MyChart, go to [email or web address].    Your next appointment:   1 year(s)  The format for your next appointment:   In Person  Provider:   Berniece Salines, DO

## 2022-08-20 NOTE — Addendum Note (Signed)
Addended by: Linton Ham on: 08/20/2022 09:30 AM   Modules accepted: Orders

## 2022-09-26 ENCOUNTER — Ambulatory Visit (HOSPITAL_BASED_OUTPATIENT_CLINIC_OR_DEPARTMENT_OTHER)
Admission: RE | Admit: 2022-09-26 | Discharge: 2022-09-26 | Disposition: A | Discharge status: Home / Self Care | Payer: Federal, State, Local not specified - PPO | Source: Ambulatory Visit | Attending: Cardiology | Admitting: Cardiology

## 2022-09-26 DIAGNOSIS — I7090 Unspecified atherosclerosis: Secondary | ICD-10-CM | POA: Insufficient documentation

## 2022-11-15 ENCOUNTER — Encounter: Payer: Self-pay | Admitting: Cardiology

## 2023-01-16 DIAGNOSIS — E119 Type 2 diabetes mellitus without complications: Secondary | ICD-10-CM | POA: Diagnosis not present

## 2023-01-16 DIAGNOSIS — E559 Vitamin D deficiency, unspecified: Secondary | ICD-10-CM | POA: Diagnosis not present

## 2023-01-16 DIAGNOSIS — Z9889 Other specified postprocedural states: Secondary | ICD-10-CM | POA: Diagnosis not present

## 2023-01-16 DIAGNOSIS — Z79899 Other long term (current) drug therapy: Secondary | ICD-10-CM | POA: Diagnosis not present

## 2023-01-25 DIAGNOSIS — M25561 Pain in right knee: Secondary | ICD-10-CM | POA: Diagnosis not present

## 2023-02-08 ENCOUNTER — Ambulatory Visit
Admission: RE | Admit: 2023-02-08 | Discharge: 2023-02-08 | Disposition: A | Discharge status: Home / Self Care | Payer: Medicare PPO | Source: Ambulatory Visit | Attending: Physician Assistant | Admitting: Physician Assistant

## 2023-02-08 ENCOUNTER — Ambulatory Visit (INDEPENDENT_AMBULATORY_CARE_PROVIDER_SITE_OTHER): Payer: Medicare PPO

## 2023-02-08 VITALS — BP 97/68 | HR 81 | Temp 97.2°F | Resp 18

## 2023-02-08 DIAGNOSIS — B349 Viral infection, unspecified: Secondary | ICD-10-CM | POA: Diagnosis not present

## 2023-02-08 DIAGNOSIS — R112 Nausea with vomiting, unspecified: Secondary | ICD-10-CM | POA: Diagnosis not present

## 2023-02-08 DIAGNOSIS — R197 Diarrhea, unspecified: Secondary | ICD-10-CM | POA: Diagnosis not present

## 2023-02-08 LAB — POCT URINALYSIS DIP (MANUAL ENTRY)
Blood, UA: NEGATIVE
Glucose, UA: NEGATIVE mg/dL
Leukocytes, UA: NEGATIVE
Nitrite, UA: NEGATIVE
Spec Grav, UA: 1.025 (ref 1.010–1.025)
Urobilinogen, UA: 1 E.U./dL
pH, UA: 6 (ref 5.0–8.0)

## 2023-02-08 MED ORDER — ONDANSETRON 4 MG PO TBDP
4.0000 mg | ORAL_TABLET | Freq: Once | ORAL | Status: AC
  Administered 2023-02-08: 4 mg via ORAL

## 2023-02-08 MED ORDER — ONDANSETRON 4 MG PO TBDP: 4.0000 mg | ORAL_TABLET | Freq: Three times a day (TID) | ORAL | 0 refills | Status: AC | PRN

## 2023-02-08 MED ORDER — SODIUM CHLORIDE 0.9 % IV BOLUS
1000.0000 mL | Freq: Once | INTRAVENOUS | Status: AC
  Administered 2023-02-08: 1000 mL via INTRAVENOUS

## 2023-02-08 NOTE — Discharge Instructions (Signed)
I am glad that you are feeling better after fluids.  Please use Zofran on a scheduled basis for the next several days.  Make sure that you are drinking plenty of fluid and eat a bland diet.  Avoid spicy/acidic/fatty foods.  Follow-up with your primary care first the next week.  We will contact you if your lab work is abnormal.  If anything changes or worsens and you have shortness of breath, abdominal pain, recurrent nausea and vomiting even with medication, severe diarrhea, blood in your stool, blood in your vomit you need to go to the emergency room immediately.

## 2023-02-08 NOTE — ED Triage Notes (Signed)
Vomiting today with diarrhea.  Fever and chills today.  States she just doesn't feel well.  States she feels dizzy when she first stands up.

## 2023-02-08 NOTE — ED Provider Notes (Signed)
[facility]    CSN: [id number] Arrival date & time: 02/08/23  1701      History   Chief Complaint Chief Complaint  Patient presents with   Nasal Congestion    Body aches, low grade fever, no appetite, coughing up green mucus, headache - Entered by patient    HPI Melinda Phelps is a 67 y.o. female.   Patient presents today with a several day history of URI symptoms.  Reports she initially had some congestion and cough.  Over the past 24 hours she has developed mild diarrhea but also nausea and vomiting to the point that she has had difficulty keeping anything down.  She ate several crackers earlier today and ended up vomiting them up very soon after ingestion.  She has not tried any over-the-counter medication.  Denies any known sick contacts.  Denies any suspicious food intake, recent travel, history of gastrointestinal disorder, recent antibiotic use.  Denies any associated melena, hematochezia, hematemesis.  Does report that when she changes position she feels dizzy but otherwise denies any ongoing lightheadedness, chest pain, shortness of breath.    Past Medical History:  Diagnosis Date   Chest discomfort    Colon polyp 2010?   Depression    Diabetes mellitus    Hyperlipidemia    Sleep apnea     Patient Active Problem List   Diagnosis Date Noted   History of colonic polyps 12/06/2015   Rectal bleeding 12/06/2015   CHEST PAIN 08/10/2009   DM 08/04/2009   HYPERLIPIDEMIA 08/04/2009   DEPRESSION 08/04/2009   BOWEL OBSTRUCTION 08/04/2009    Past Surgical History:  Procedure Laterality Date   CESAREAN SECTION  1986, 1991, 1997   COLONOSCOPY  2010?   [place]   COLONOSCOPY WITH PROPOFOL N/A 01/18/2016   Procedure: COLONOSCOPY WITH PROPOFOL;  Surgeon: Earline MayotteJeffrey W Byrnett, MD;  Location: [facility] ENDOSCOPY;  Service: Endoscopy;  Laterality: N/A;   EXPLORATORY LAPAROTOMY  1986   HERNIA REPAIR  2004   LAPAROSCOPIC GASTRIC SLEEVE RESECTION  2012    Dr Smitty CordsBruce   TONSILLECTOMY  1964   TOTAL ABDOMINAL HYSTERECTOMY W/ BILATERAL SALPINGOOPHORECTOMY  2003    OB History     Gravida  3   Para  3   Term      Preterm      AB      Living         SAB      IAB      Ectopic      Multiple      Live Births           Obstetric Comments  1st Menstrual Cycle:  14  1st Pregnancy:  28           Home Medications    Prior to Admission medications   Medication Sig Start Date End Date Taking? Authorizing Provider  ondansetron (ZOFRAN-ODT) 4 MG disintegrating tablet Take 1 tablet (4 mg total) by mouth every 8 (eight) hours as needed for nausea or vomiting. 02/08/23  Yes Jonael Paradiso K, PA-C  albuterol (VENTOLIN HFA) 108 (90 Base) MCG/ACT inhaler Inhale into the lungs every 6 (six) hours as needed for wheezing or shortness of breath.    [provider]  atorvastatin (LIPITOR) 40 MG tablet Take 40 mg by mouth daily.      [provider]  Biotin 5000 MCG CAPS Take 5,000 mcg by mouth daily.    [provider]  clobetasol cream (  TEMOVATE) 0.05 % Apply 1 application topically as directed.    [provider]  Coenzyme Q10 (CO Q 10 PO) Take by mouth.    [provider]  desloratadine (CLARINEX) 5 MG tablet Take 5 mg by mouth daily.      [provider]  escitalopram (LEXAPRO) 20 MG tablet Take 20 mg by mouth daily.      [provider]  fish oil-omega-3 fatty acids 1000 MG capsule Take 1 capsule by mouth daily.      [provider]  Magnesium 250 MG TABS Take 250 mg by mouth daily.    [provider]  metFORMIN (GLUCOPHAGE) 500 MG tablet Take 500 mg by mouth 2 (two) times daily with a meal.    [provider]  Multiple Vitamin (MULTIVITAMIN) tablet Take 1 tablet by mouth daily.      [provider]  Semaglutide,0.25 or 0.5MG /DOS, (OZEMPIC, 0.25 OR 0.5 MG/DOSE,) 2 MG/1.5ML SOPN INJECT 0.5MG  S.Q. ONCE A WEEK. Subcutaneous as directed for 90  days 08/17/19   [provider]    Family History Family History  Problem Relation Age of Onset   Heart failure Father    Heart attack Brother    Cancer Mother 54       lung    Social History Social History   Tobacco Use   Smoking status: Never  Substance Use Topics   Alcohol use: No    Alcohol/week: 0.0 standard drinks of alcohol   Drug use: No     Allergies   Patient has no known allergies.   Review of Systems Review of Systems  Constitutional:  Positive for activity change. Negative for appetite change, fatigue and fever.  HENT:  Positive for congestion. Negative for sinus pressure, sneezing and sore throat.   Respiratory:  Positive for cough. Negative for shortness of breath.   Cardiovascular:  Negative for chest pain.  Gastrointestinal:  Positive for diarrhea, nausea and vomiting. Negative for abdominal pain.  Neurological:  Positive for dizziness. Negative for light-headedness and headaches.     Physical Exam Triage Vital Signs ED Triage Vitals  Enc Vitals Group     BP 02/08/23 1713 92/64     Pulse Rate 02/08/23 1711 68     Resp 02/08/23 1711 18     Temp 02/08/23 1711 (!) 97.2 F (36.2 C)     Temp Source 02/08/23 1711 Oral     SpO2 02/08/23 1711 96 %     Weight --      Height --      Head Circumference --      Peak Flow --      Pain Score 02/08/23 1712 0     Pain Loc --      Pain Edu? --      Excl. in GC? --    No data found.  Updated Vital Signs BP 126/75 (BP Location: Right Arm)   Pulse 84   Temp (!) 97.2 F (36.2 C) (Oral)   Resp 18   SpO2 98%   Visual Acuity Right Eye Distance:   Left Eye Distance:   Bilateral Distance:    Right Eye Near:   Left Eye Near:    Bilateral Near:     Physical Exam Vitals reviewed.  Constitutional:      General: She is awake. She is not in acute distress.    Appearance: Normal appearance. She is well-developed. She is ill-appearing.     Comments: Very pleasant female  laying on exam room  table in no acute distress  HENT:     Head: Normocephalic and atraumatic.     Mouth/Throat:     Mouth: Mucous membranes are dry.  Cardiovascular:     Rate and Rhythm: Normal rate and regular rhythm.     Heart sounds: Normal heart sounds, S1 normal and S2 normal. No murmur heard. Pulmonary:     Effort: Pulmonary effort is normal.     Breath sounds: Normal breath sounds. No wheezing, rhonchi or rales.     Comments: Clear to auscultation bilaterally Abdominal:     General: Bowel sounds are normal.     Palpations: Abdomen is soft.     Tenderness: There is no abdominal tenderness. There is no right CVA tenderness, left CVA tenderness, guarding or rebound.     Comments: Benign abdominal exam  Musculoskeletal:     Right lower leg: No edema.     Left lower leg: No edema.  Psychiatric:        Behavior: Behavior is cooperative.      UC Treatments / Results  Labs (all labs ordered are listed, but only abnormal results are displayed) Labs Reviewed  POCT URINALYSIS DIP (MANUAL ENTRY) - Abnormal; Notable for the following components:      Result Value   Bilirubin, UA small (*)    Ketones, POC UA large (80) (*)    Protein Ur, POC trace (*)    All other components within normal limits  CBC WITH DIFFERENTIAL/PLATELET  COMPREHENSIVE METABOLIC PANEL    EKG   Radiology DG Abdomen 1 View  Result Date: 02/08/2023 CLINICAL DATA:  Nausea and vomiting EXAM: ABDOMEN - 1 VIEW COMPARISON:  None Available. FINDINGS: Nonobstructive bowel gas pattern. Rounded radiopaque densities overlying the central abdomen, likely hernia mesh. Surgical clips of the left quadrant. No radio-opaque calculi or other significant radiographic abnormality are seen. IMPRESSION: Nonobstructive bowel gas pattern. Electronically Signed   By: Allegra Lai M.D.   On: 02/08/2023 18:17    Procedures Procedures (including critical care time)  Medications Ordered in UC Medications  ondansetron (ZOFRAN-ODT) disintegrating  tablet 4 mg (4 mg Oral Given 02/08/23 1728)  sodium chloride 0.9 % bolus 1,000 mL (0 mLs Intravenous Stopped 02/08/23 1754)    Initial Impression / Assessment and Plan / UC Course  I have reviewed the triage vital signs and the nursing notes.  Pertinent labs & imaging results that were available during my care of the patient were reviewed by me and considered in my medical decision making (see chart for details).     Patient was initially borderline hypotensive but was given a bolus of 1 L in clinic with significant improvement of symptoms as well as normalization of her blood pressure.  Urine was obtained that showed increased ketones consistent with decreased oral intake but no dehydration after fluids.  CBC and CMP were obtained and are pending.  KUB was obtained given history of obstruction which showed nonobstructive bowel gas pattern.  Patient was able to eat and drink after a dose of Zofran and fluids without recurrent emesis.  She was instructed to use Zofran on a scheduled basis for the next several days to encourage oral intake and then decrease use to as needed thereafter.  She is to eat a bland diet and drink plenty of fluids.  Discussed that she should follow-up with her primary care next week for reevaluation.  If she has any worsening or changing symptoms including abdominal pain, fever, nausea,  vomiting, melena, hematochezia, hematemesis she needs to go to the emergency room to which she expressed understanding.  Strict return precautions given.  All questions answered to patient satisfaction.  Final Clinical Impressions(s) / UC Diagnoses   Final diagnoses:  Nausea vomiting and diarrhea  Viral illness     Discharge Instructions      I am glad that you are feeling better after fluids.  Please use Zofran on a scheduled basis for the next several days.  Make sure that you are drinking plenty of fluid and eat a bland diet.  Avoid spicy/acidic/fatty foods.  Follow-up with your primary  care first the next week.  We will contact you if your lab work is abnormal.  If anything changes or worsens and you have shortness of breath, abdominal pain, recurrent nausea and vomiting even with medication, severe diarrhea, blood in your stool, blood in your vomit you need to go to the emergency room immediately.     ED Prescriptions     Medication Sig Dispense Auth. Provider   ondansetron (ZOFRAN-ODT) 4 MG disintegrating tablet Take 1 tablet (4 mg total) by mouth every 8 (eight) hours as needed for nausea or vomiting. 20 tablet Danzell Birky, Noberto RetortErin K, PA-C      PDMP not reviewed this encounter.   Jeani HawkingRaspet, Lamar Naef K, PA-C 02/08/23 1831

## 2023-02-08 NOTE — ED Notes (Signed)
Patient able to keep water and cracker down.  States she feels so much better than when she first arrived.

## 2023-02-08 NOTE — ED Notes (Signed)
Patient's initial blood pressure prior to triage was 75/42.

## 2023-02-10 LAB — COMPREHENSIVE METABOLIC PANEL
ALT: 31 IU/L (ref 0–32)
AST: 29 IU/L (ref 0–40)
Albumin/Globulin Ratio: 1.6 (ref 1.2–2.2)
Albumin: 3.9 g/dL (ref 3.9–4.9)
Alkaline Phosphatase: 97 IU/L (ref 44–121)
BUN/Creatinine Ratio: 23 (ref 12–28)
BUN: 14 mg/dL (ref 8–27)
Bilirubin Total: <0.2 mg/dL (ref 0.0–1.2)
CO2: 21 mmol/L (ref 20–29)
Calcium: 9.1 mg/dL (ref 8.7–10.3)
Chloride: 103 mmol/L (ref 96–106)
Creatinine, Ser: 0.61 mg/dL (ref 0.57–1.00)
Globulin, Total: 2.4 g/dL (ref 1.5–4.5)
Glucose: 171 mg/dL — ABNORMAL HIGH (ref 70–99)
Potassium: 4.6 mmol/L (ref 3.5–5.2)
Sodium: 140 mmol/L (ref 134–144)
Total Protein: 6.3 g/dL (ref 6.0–8.5)
eGFR: 99 mL/min/{1.73_m2} (ref >59)

## 2023-02-10 LAB — CBC WITH DIFFERENTIAL/PLATELET
Basophils Absolute: 0 10*3/uL (ref 0.0–0.2)
Basos: 0 %
EOS (ABSOLUTE): 0.5 10*3/uL — ABNORMAL HIGH (ref 0.0–0.4)
Eos: 3 %
Hematocrit: 45.1 % (ref 34.0–46.6)
Hemoglobin: 14.4 g/dL (ref 11.1–15.9)
Immature Grans (Abs): 0 10*3/uL (ref 0.0–0.1)
Immature Granulocytes: 0 %
Lymphocytes Absolute: 0.6 10*3/uL — ABNORMAL LOW (ref 0.7–3.1)
Lymphs: 4 %
MCH: 29.9 pg (ref 26.6–33.0)
MCHC: 31.9 g/dL (ref 31.5–35.7)
MCV: 94 fL (ref 79–97)
Monocytes Absolute: 0.5 10*3/uL (ref 0.1–0.9)
Monocytes: 3 %
Neutrophils Absolute: 13.4 10*3/uL — ABNORMAL HIGH (ref 1.4–7.0)
Neutrophils: 90 %
Platelets: 301 10*3/uL (ref 150–450)
RBC: 4.82 x10E6/uL (ref 3.77–5.28)
RDW: 12.6 % (ref 11.7–15.4)
WBC: 15 10*3/uL — ABNORMAL HIGH (ref 3.4–10.8)

## 2023-03-19 DIAGNOSIS — M25561 Pain in right knee: Secondary | ICD-10-CM | POA: Diagnosis not present

## 2023-04-09 DIAGNOSIS — M25561 Pain in right knee: Secondary | ICD-10-CM | POA: Diagnosis not present

## 2023-04-24 DIAGNOSIS — M25561 Pain in right knee: Secondary | ICD-10-CM | POA: Diagnosis not present

## 2023-05-13 ENCOUNTER — Encounter: Payer: Self-pay | Admitting: Cardiology

## 2023-06-05 ENCOUNTER — Ambulatory Visit (HOSPITAL_COMMUNITY): Payer: Federal, State, Local not specified - PPO

## 2023-06-19 ENCOUNTER — Ambulatory Visit (HOSPITAL_COMMUNITY)
Admission: RE | Admit: 2023-06-19 | Discharge: 2023-06-19 | Disposition: A | Discharge status: Home / Self Care | Payer: Medicare PPO | Source: Ambulatory Visit | Attending: Cardiology | Admitting: Cardiology

## 2023-06-19 DIAGNOSIS — I7121 Aneurysm of the ascending aorta, without rupture: Secondary | ICD-10-CM | POA: Diagnosis present

## 2023-06-19 LAB — POCT I-STAT CREATININE: Creatinine, Ser: 0.7 mg/dL (ref 0.44–1.00)

## 2023-06-19 MED ORDER — IOHEXOL 350 MG/ML SOLN
50.0000 mL | Freq: Once | INTRAVENOUS | Status: AC | PRN
  Administered 2023-06-19: 50 mL via INTRAVENOUS

## 2023-07-08 ENCOUNTER — Encounter: Payer: Self-pay | Admitting: Cardiology

## 2024-07-20 ENCOUNTER — Ambulatory Visit (INDEPENDENT_AMBULATORY_CARE_PROVIDER_SITE_OTHER): Admitting: Student

## 2024-07-20 ENCOUNTER — Ambulatory Visit (HOSPITAL_BASED_OUTPATIENT_CLINIC_OR_DEPARTMENT_OTHER)

## 2024-07-20 ENCOUNTER — Other Ambulatory Visit: Payer: Self-pay

## 2024-07-20 ENCOUNTER — Encounter: Payer: Self-pay | Admitting: Cardiology

## 2024-07-20 DIAGNOSIS — M79671 Pain in right foot: Secondary | ICD-10-CM

## 2024-07-20 MED ORDER — METHYLPREDNISOLONE 4 MG PO TBPK: ORAL_TABLET | ORAL | 0 refills | Status: DC

## 2024-07-20 NOTE — Progress Notes (Signed)
 Chief Complaint: Right foot pain    Discussed the use of AI scribe software for clinical note transcription with the patient, who gave verbal consent to proceed.  History of Present Illness Melinda Phelps is a 68 year old female with a history of plantar fasciitis who presents with right foot pain and swelling. She has experienced right foot pain and swelling for one week. Initially localized to the bottom lateral edge of the foot, the pain is now diffuse, affecting the entire foot. It is severe, disrupts sleep, and is present without touch. Ankle movement, especially dorsiflexion and plantarflexion, exacerbates the pain. She has tried elevating the foot and applying ice, which initially provided relief, but now she is unsure where to apply ice due to the diffuse pain. She cannot take ibuprofen due to a history of bariatric surgery and finds Tylenol Arthritis ineffective. She is concerned about her ability to walk during an upcoming beach vacation. No calf pain is present, and pain is noted on the top of the foot without specific pinpoint pain.    Surgical History:   None  PMH/PSH/Family History/Social History/Meds/Allergies:    Past Medical History:  Diagnosis Date   Chest discomfort    Colon polyp 2010?   Depression    Diabetes mellitus    Hyperlipidemia    Sleep apnea    Past Surgical History:  Procedure Laterality Date   CESAREAN SECTION  1986, 1991, 1997   COLONOSCOPY  2010?   Dr Evalee in [place]   COLONOSCOPY WITH PROPOFOL  N/A 01/18/2016   Procedure: COLONOSCOPY WITH PROPOFOL ;  Surgeon: Reyes LELON Cota, MD;  Location: [facility] ENDOSCOPY;  Service: Endoscopy;  Laterality: N/A;   EXPLORATORY LAPAROTOMY  1986   HERNIA REPAIR  2004   LAPAROSCOPIC GASTRIC SLEEVE RESECTION  2012   Dr Wolm   TONSILLECTOMY  1964   TOTAL ABDOMINAL HYSTERECTOMY W/ BILATERAL SALPINGOOPHORECTOMY  2003   Social History   Socioeconomic History   Marital  status: Widowed    Spouse name: Not on file   Number of children: Not on file   Years of education: Not on file   Highest education level: Not on file  Occupational History    Employer: [employer]  Tobacco Use   Smoking status: Never   Smokeless tobacco: Not on file  Substance and Sexual Activity   Alcohol use: No    Alcohol/week: 0.0 standard drinks of alcohol   Drug use: No   Sexual activity: Not on file  Other Topics Concern   Not on file  Social History Narrative   No regular exercise   Social Drivers of Health   Financial Resource Strain: Not on file  Food Insecurity: Not on file  Transportation Needs: Not on file  Physical Activity: Not on file  Stress: Not on file  Social Connections: Not on file   Family History  Problem Relation Age of Onset   Heart failure Father    Heart attack Brother    Cancer Mother 27       lung   No Known Allergies Current Outpatient Medications  Medication Sig Dispense Refill   methylPREDNISolone  (MEDROL  DOSEPAK) 4 MG TBPK tablet Take per packet instructions 1 each 0   albuterol (VENTOLIN HFA) 108 (90 Base) MCG/ACT inhaler Inhale into the lungs every 6 (six)  hours as needed for wheezing or shortness of breath.     atorvastatin (LIPITOR) 40 MG tablet Take 40 mg by mouth daily.       Biotin 5000 MCG CAPS Take 5,000 mcg by mouth daily.     clobetasol cream (TEMOVATE) 0.05 % Apply 1 application topically as directed.     Coenzyme Q10 (CO Q 10 PO) Take by mouth.     desloratadine (CLARINEX) 5 MG tablet Take 5 mg by mouth daily.       escitalopram (LEXAPRO) 20 MG tablet Take 20 mg by mouth daily.       fish oil-omega-3 fatty acids 1000 MG capsule Take 1 capsule by mouth daily.       Magnesium 250 MG TABS Take 250 mg by mouth daily.     metFORMIN (GLUCOPHAGE) 500 MG tablet Take 500 mg by mouth 2 (two) times daily with a meal.     Multiple Vitamin (MULTIVITAMIN) tablet Take 1 tablet by mouth daily.       ondansetron  (ZOFRAN -ODT) 4 MG  disintegrating tablet Take 1 tablet (4 mg total) by mouth every 8 (eight) hours as needed for nausea or vomiting. 20 tablet 0   Semaglutide,0.25 or 0.5MG /DOS, (OZEMPIC, 0.25 OR 0.5 MG/DOSE,) 2 MG/1.5ML SOPN INJECT 0.5MG  S.Q. ONCE A WEEK. Subcutaneous as directed for 90 days     No current facility-administered medications for this visit.   No results found.  Review of Systems:   A ROS was performed including pertinent positives and negatives as documented in the HPI.  Physical Exam :   Constitutional: NAD and appears stated age Neurological: Alert and oriented Psych: Appropriate affect and cooperative There were no vitals taken for this visit.   Comprehensive Musculoskeletal Exam:    Mild to moderate swelling to the left midfoot without overlying erythema or warmth.  There is diffuse tenderness throughout the dorsal midfoot  without any pinpoint areas.  Plantar fascia and insertion of the calcaneus are nontender.  Some discomfort is noted in terminal ankle dorsiflexion plantarflexion.  Dorsalis pedis palpable with brisk capillary refill distally.  Imaging:   Xray (left foot 3 views): Negative for acute abnormality.  Large plantar calcaneal spur with surrounding calcifications.  Mild to moderate arthritis within the midfoot.   I personally reviewed and interpreted the radiographs.      Assessment & Plan Right foot pain  Acute right foot pain with swelling affects both plantar and dorsal aspects. X-rays reveal mild degenerative changes, a large calcaneal spur, and calcifications along the insertion of the plantar fascia.  Given that most of her tenderness and pain is dorsally located, extensor tendinitis is suspected, with a stress fracture and symptomatic plantar fasciitis being less likely. Prescribe a short course of oral prednisone for inflammation and swelling. Advise icing and elevation to manage swelling. Instruct her to monitor symptoms and return if pain persists or worsens,  especially if pinpoint pain develops.      I personally saw and evaluated the patient, and participated in the management and treatment plan.  Leonce Reveal, PA-C Orthopedics

## 2024-08-03 ENCOUNTER — Telehealth: Payer: Self-pay | Admitting: Cardiology

## 2024-08-03 DIAGNOSIS — M899 Disorder of bone, unspecified: Secondary | ICD-10-CM | POA: Insufficient documentation

## 2024-08-03 NOTE — Telephone Encounter (Signed)
 Pt has been advised to have a cardiac CT done annually. She has an annual f/u scheduled 12/12 and would like to know of she needs the cardiac CT before appt. Please advise.

## 2024-09-01 ENCOUNTER — Encounter (INDEPENDENT_AMBULATORY_CARE_PROVIDER_SITE_OTHER): Payer: Self-pay | Admitting: *Deleted

## 2024-09-07 ENCOUNTER — Encounter: Payer: Self-pay | Admitting: Radiology

## 2024-09-15 ENCOUNTER — Encounter: Payer: Self-pay | Admitting: Gastroenterology

## 2024-09-30 ENCOUNTER — Encounter: Payer: Self-pay | Admitting: Orthopedic Surgery

## 2024-09-30 ENCOUNTER — Ambulatory Visit (INDEPENDENT_AMBULATORY_CARE_PROVIDER_SITE_OTHER): Admitting: Orthopedic Surgery

## 2024-09-30 VITALS — BP 131/81 | HR 86 | Ht 67.0 in | Wt 157.0 lb

## 2024-09-30 DIAGNOSIS — M79671 Pain in right foot: Secondary | ICD-10-CM | POA: Diagnosis not present

## 2024-09-30 DIAGNOSIS — M25561 Pain in right knee: Secondary | ICD-10-CM | POA: Insufficient documentation

## 2024-09-30 DIAGNOSIS — G8929 Other chronic pain: Secondary | ICD-10-CM | POA: Diagnosis not present

## 2024-09-30 NOTE — Patient Instructions (Signed)
 While we are working on your approval for MRI please go ahead and call to schedule your appointment with [facility] within at least one (1) week.   Central Scheduling [phone]

## 2024-10-02 NOTE — Progress Notes (Signed)
 New Patient Visit  Assessment: Melinda Phelps is a 68 y.o. female with the following: 1. Chronic foot pain, right  Plan: DEISY OZBUN has pain in the medial aspect of the right foot.  No specific injury.  She notes intermittent redness and swelling, when it is very painful.  Currently, the swelling is not as severe.  She does have tenderness over the medial midfoot.  Her foot is not flat.  I am concerned about a chronic injury or a stress fracture causing her pain currently.  As result, I am recommending an MRI.  Once the MRI is complete, we will see her back in clinic to discuss the findings  Follow-up: Return for After MRI.  Subjective:  Chief Complaint  Patient presents with   Foot Pain    Right / medial foot painful, gets swollen/ red sometimes     History of Present Illness: Melinda Phelps is a 68 y.o. female who presents for evaluation of right foot pain.  She reports that she has had foot pain, primarily medial for a couple of months.  No specific injury.  She does note occasional swelling, which makes the pain severe.  She has associated redness.  She was evaluated recently, and the concern was for some tendinitis.  Medications have not been effective.  She is having difficulty ambulating when the pain flares.  She does not have a history of gout or other condition that could contribute to her ongoing symptoms.  She does not feel as though she has a flatfoot.   Review of Systems: No fevers or chills No numbness or tingling No chest pain No shortness of breath No bowel or bladder dysfunction No GI distress No headaches   Medical History:  Past Medical History:  Diagnosis Date   Chest discomfort    Colon polyp 2010?   Depression    Diabetes mellitus    Hyperlipidemia    Sleep apnea     Past Surgical History:  Procedure Laterality Date   CESAREAN SECTION  1986, 1991, 1997   COLONOSCOPY  2010?   Dr Evalee in [place]   COLONOSCOPY WITH PROPOFOL  N/A  01/18/2016   Procedure: COLONOSCOPY WITH PROPOFOL ;  Surgeon: Reyes LELON Cota, MD;  Location: [facility] ENDOSCOPY;  Service: Endoscopy;  Laterality: N/A;   EXPLORATORY LAPAROTOMY  1986   HERNIA REPAIR  2004   LAPAROSCOPIC GASTRIC SLEEVE RESECTION  2012   Dr Wolm   TONSILLECTOMY  1964   TOTAL ABDOMINAL HYSTERECTOMY W/ BILATERAL SALPINGOOPHORECTOMY  2003    Family History  Problem Relation Age of Onset   Heart failure Father    Heart attack Brother    Cancer Mother 72       lung   Social History   Tobacco Use   Smoking status: Never  Substance Use Topics   Alcohol use: No    Alcohol/week: 0.0 standard drinks of alcohol   Drug use: No    No Known Allergies  Current Meds  Medication Sig   atorvastatin (LIPITOR) 40 MG tablet Take 40 mg by mouth daily.     Biotin 5000 MCG CAPS Take 5,000 mcg by mouth daily.   clobetasol cream (TEMOVATE) 0.05 % Apply 1 application topically as directed.   Coenzyme Q10 (CO Q 10 PO) Take by mouth.   desloratadine (CLARINEX) 5 MG tablet Take 5 mg by mouth daily.     escitalopram (LEXAPRO) 20 MG tablet Take 20 mg by mouth daily.     fish  oil-omega-3 fatty acids 1000 MG capsule Take 1 capsule by mouth daily.     Magnesium 250 MG TABS Take 250 mg by mouth daily.   metFORMIN (GLUCOPHAGE) 500 MG tablet Take 500 mg by mouth 2 (two) times daily with a meal.   methylPREDNISolone  (MEDROL  DOSEPAK) 4 MG TBPK tablet Take per packet instructions   Multiple Vitamin (MULTIVITAMIN) tablet Take 1 tablet by mouth daily.     ondansetron  (ZOFRAN -ODT) 4 MG disintegrating tablet Take 1 tablet (4 mg total) by mouth every 8 (eight) hours as needed for nausea or vomiting.   Semaglutide,0.25 or 0.5MG /DOS, (OZEMPIC, 0.25 OR 0.5 MG/DOSE,) 2 MG/1.5ML SOPN INJECT 0.5MG  S.Q. ONCE A WEEK. Subcutaneous as directed for 90 days    Objective: BP 131/81   Pulse 86   Ht 5' 7 (1.702 m)   Wt 157 lb (71.2 kg)   BMI 24.59 kg/m   Physical Exam:  General: Alert and oriented. and No  acute distress. Gait: Right sided antalgic gait.  Evaluation of the right foot demonstrates diffuse swelling over the medial hindfoot and midfoot area.  There is some tenderness to palpation in this area.  Toes are warm and well-perfused.  She has a normal-appearing arch based on physical exam.  Sensation intact of the dorsum of the foot.  There is some redness over the medial midfoot.  IMAGING: I personally reviewed images previously obtained in clinic  X-rays were previously obtained.  No acute injuries.  No midfoot collapse.  Minimal degenerative changes.  She has a Haglund's deformity.  There is a plantar calcaneal heel spur.   New Medications:  No orders of the defined types were placed in this encounter.     Oneil DELENA Horde, MD  10/02/2024 11:30 AM

## 2024-10-09 ENCOUNTER — Ambulatory Visit (HOSPITAL_COMMUNITY)
Admission: RE | Admit: 2024-10-09 | Discharge: 2024-10-09 | Disposition: A | Source: Ambulatory Visit | Attending: Orthopedic Surgery

## 2024-10-09 DIAGNOSIS — G8929 Other chronic pain: Secondary | ICD-10-CM

## 2024-10-16 ENCOUNTER — Ambulatory Visit: Admitting: Orthopedic Surgery

## 2024-10-16 ENCOUNTER — Encounter (HOSPITAL_BASED_OUTPATIENT_CLINIC_OR_DEPARTMENT_OTHER): Payer: Self-pay | Admitting: Family

## 2024-10-16 ENCOUNTER — Ambulatory Visit (INDEPENDENT_AMBULATORY_CARE_PROVIDER_SITE_OTHER): Admitting: Family

## 2024-10-16 ENCOUNTER — Encounter: Payer: Self-pay | Admitting: Orthopedic Surgery

## 2024-10-16 VITALS — Ht 67.0 in | Wt 161.5 lb

## 2024-10-16 VITALS — BP 102/68 | HR 85 | Ht 67.0 in | Wt 162.8 lb

## 2024-10-16 DIAGNOSIS — E785 Hyperlipidemia, unspecified: Secondary | ICD-10-CM

## 2024-10-16 DIAGNOSIS — G8929 Other chronic pain: Secondary | ICD-10-CM

## 2024-10-16 DIAGNOSIS — I7121 Aneurysm of the ascending aorta, without rupture: Secondary | ICD-10-CM

## 2024-10-16 DIAGNOSIS — M79671 Pain in right foot: Secondary | ICD-10-CM | POA: Diagnosis not present

## 2024-10-16 DIAGNOSIS — I2510 Atherosclerotic heart disease of native coronary artery without angina pectoris: Secondary | ICD-10-CM | POA: Diagnosis not present

## 2024-10-16 NOTE — Progress Notes (Signed)
 Return patient Visit  Assessment: Melinda Phelps is a 68 y.o. female with the following: 1. Chronic foot pain, right  Plan: Melinda Phelps has pain in the medial aspect of the right foot.  MRI with irritation of the Achilles tendon, peroneal tendons, some reactive bone through the midfoot area.  Currently, she is not symptomatic.  Discussed the MRI findings.  If she has another flare, would recommend a walking boot.  She should evaluate her shoe wear, and consider some newer shoes with more support.  If she continues to have these flares, would consider referral to a foot specialist.   Follow-up: No follow-ups on file.  Subjective:  Chief Complaint  Patient presents with   Foot Pain    R/ here to go over my MRI results. My foot isn't bothering me today so far.    History of Present Illness: Melinda Phelps is a 68 y.o. female who returns for evaluation of right foot pain.  I saw her in clinic a couple of weeks ago.  She was having a flare of diffuse right foot pain, primarily medial and in the midfoot area.  Since then, she has obtained an MRI.  Currently, she states that she is not having as much pain in the right foot.  Review of Systems: No fevers or chills No numbness or tingling No chest pain No shortness of breath No bowel or bladder dysfunction No GI distress No headaches    Objective: Ht 5' 7 (1.702 m)   Wt 161 lb 8 oz (73.3 kg)   BMI 25.29 kg/m   Physical Exam:  General: Alert and oriented. and No acute distress. Gait: Right sided antalgic gait.  Evaluation of the right foot demonstrates diffuse swelling over the medial hindfoot and midfoot area.  There is some tenderness to palpation in this area.  Toes are warm and well-perfused.  She has a normal-appearing arch based on physical exam.  Sensation intact of the dorsum of the foot.  There is some redness over the medial midfoot.  IMAGING: I personally reviewed images previously obtained in clinic  Right  foot MRI  IMPRESSION: 1. Mild tendinosis of the peroneus longus with subcortical marrow edema in the adjacent plantar lateral aspect of the cuboid. 2. Mild tendinosis of the peroneus brevis. 3. Mild distal Achilles tendinosis. 4. 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 5. Mild osteoarthritis of the second TMT joint with subchondral marrow edema. 6. Mild marrow edema in the proximal second, third, fourth and fifth metatarsals likely reflecting mild stress reaction.     New Medications:  No orders of the defined types were placed in this encounter.     Oneil DELENA Horde, MD  10/16/2024 8:58 AM

## 2024-10-16 NOTE — Patient Instructions (Signed)
 Medication Instructions:  Continue your current medications.  *If you need a refill on your cardiac medications before your next appointment, please call your pharmacy*  Lab Work: Your physician recommends that you return for lab work 3-7 days prior to CT for BMP  If you have labs (blood work) drawn today and your tests are completely normal, you will receive your results only by: MyChart Message (if you have MyChart) OR A paper copy in the mail If you have any lab test that is abnormal or we need to change your treatment, we will call you to review the results.  Testing/Procedures: Your provider has recommended you have a CT Aorta for monitoring of your aortic aneurysm.   Follow-Up: At [facility], you and your health needs are our priority.  As part of our continuing mission to provide you with exceptional heart care, our providers are all part of one team.  This team includes your primary Cardiologist (physician) and Advanced Practice Providers or APPs (Physician Assistants and Nurse Practitioners) who all work together to provide you with the care you need, when you need it.  Your next appointment:   1 year(s)  Provider:   Kardie Tobb, DO or Reche GORMAN Finder, NP    We recommend signing up for the patient portal called MyChart.  Sign up information is provided on this After Visit Summary.  MyChart is used to connect with patients for Virtual Visits (Telemedicine).  Patients are able to view lab/test results, encounter notes, upcoming appointments, etc.  Non-urgent messages can be sent to your provider as well.   To learn more about what you can do with MyChart, go to [email or web address].   Other Instructions  Information About Your Aneurysm  One of your tests has shown an aneurysm of your ascending aorta. On your last last CT it was 40mm. As long as it is <32mm we will repeat imaging once per year. If it gets >79mm we will monitor every 6 months instead.  . The  word aneurysm refers to a bulge in an artery (blood vessel). Most people think of them in the context of an emergency, but yours was found incidentally. At this point there is nothing you need to do from a procedure standpoint, but there are some important things to keep in mind for day-to-day life.  Mainstays of therapy for aneurysms include very good blood pressure control, healthy lifestyle, and avoiding tobacco products and street drugs. Research has raised concern that antibiotics in the fluoroquinolone class could be associated with increased risk of having an aneurysm develop or tear. This includes medicines that end in floxacin, like Cipro or Levaquin. Make sure to discuss this information with other healthcare providers if you require antibiotics.  Since aneurysms can run in families, you should discuss your diagnosis with first degree relatives as they may need to be screened for this. Regular mild-moderate physical exercise is important, but avoid heavy lifting/weight lifting over 30lbs, chopping wood, shoveling snow or digging heavy earth with a shovel. It is best to avoid activities that cause grunting or straining (medically referred to as a Valsalva maneuver). This happens when a person bears down against a closed throat to increase the strength of arm or abdominal muscles. There's often a tendency to do this when lifting heavy weights, doing sit-ups, push-ups or chin-ups, etc., but it may be harmful.  This is a finding I would expect to be monitored periodically by your cardiology team. Most unruptured thoracic aortic aneurysms cause no  symptoms, so they are often found during exams for other conditions. Contact a health care provider if you develop any discomfort in your upper back, neck, abdomen, trouble swallowing, cough or hoarseness, or unexplained weight loss. Get help right away if you develop severe pain in your upper back or abdomen that may move into your chest and arms, or any  other concerning symptoms such as shortness of breath or fever.

## 2024-10-16 NOTE — Progress Notes (Signed)
 Cardiology Office Note   Date:  10/16/2024  ID:  DAVIANNA Phelps, DOB 02/23/1956, MRN [id number] PCP: Johnson Morna FALCON, NP  [facility] Providers Cardiologist:  Dub Huntsman, DO     History of Present Illness Melinda Phelps is a 68 y.o. female with hx of nonobstructive CAD (09/2022 calcium score 86 placing her in 81st percentile), ascending aortic aneurysm (09/2022 ascending aorta 43mm ? 06/2023 ascending aorta 40mm), HLD, DM2, sleep apnea. Family history of cardiovascular disease.   Presents today for follow up independently. Doing well since last seen. Reports no shortness of breath nor dyspnea on exertion. Reports no chest pain, pressure, or tightness. No edema, orthopnea, PND. Reports no palpitations.  Labs with PCP 07/2024 68.  ROS: Please see the history of present illness.    All other systems reviewed and are negative.   Studies Reviewed EKG Interpretation Date/Time:  Friday October 16 2024 11:25:17 EST Ventricular Rate:  84 PR Interval:  186 QRS Duration:  82 QT Interval:  394 QTC Calculation: 465 R Axis:   55  Text Interpretation: Normal sinus rhythm Normal ECG Confirmed by Melinda Phelps ([id number]) on 10/16/2024 11:47:45 AM    Cardiac Studies & Procedures   ______________________________________________________________________________________________          CT SCANS  CT CARDIAC SCORING (SELF PAY ONLY) 09/26/2022  Addendum 09/30/2022  9:04 PM ADDENDUM REPORT: 09/30/2022 21:02  CLINICAL DATA:  Cardiovascular Disease Risk stratification  EXAM: Coronary Calcium Score  TECHNIQUE: A gated, non-contrast computed tomography scan of the heart was performed using 3mm slice thickness. Axial images were analyzed on a dedicated workstation. Calcium scoring of the coronary arteries was performed using the Agatston method.  FINDINGS: Coronary arteries: Normal origins.  Coronary Calcium Score:  Left main: 0  Left anterior descending artery:  40  Left circumflex artery: 46  Right coronary artery: 0  Total: 86  Percentile: 81  Pericardium: Normal.  Aorta: Mildly dilated caliber of ascending aorta (43 mm). Aortic atherosclerosis noted.  Non-cardiac: See separate report from [facility].  IMPRESSION: Coronary calcium score of 86. This was 81st percentile for age-, race-, and sex-matched controls. Mildly dilated caliber of ascending aorta (43 mm). Aortic atherosclerosis noted.  RECOMMENDATIONS: Coronary artery calcium (CAC) score is a strong predictor of incident coronary heart disease (CHD) and provides predictive information beyond traditional risk factors. CAC scoring is reasonable to use in the decision to withhold, postpone, or initiate statin therapy in intermediate-risk or selected borderline-risk asymptomatic adults (age 70-75 years and LDL-C >=70 to <190 mg/dL) who do not have diabetes or established atherosclerotic cardiovascular disease (ASCVD).* In intermediate-risk (10-year ASCVD risk >=7.5% to <20%) adults or selected borderline-risk (10-year ASCVD risk >=5% to <7.5%) adults in whom a CAC score is measured for the purpose of making a treatment decision the following recommendations have been made:  If CAC=0, it is reasonable to withhold statin therapy and reassess in 5 to 10 years, as long as higher risk conditions are absent (diabetes mellitus, family history of premature CHD in first degree relatives (males <55 years; females <65 years), cigarette smoking, or LDL >=190 mg/dL).  If CAC is 1 to 99, it is reasonable to initiate statin therapy for patients >=57 years of age.  If CAC is >=100 or >=75th percentile, it is reasonable to initiate statin therapy at any age.  Cardiology referral should be considered for patients with CAC scores >=400 or >=75th percentile.  *2018 AHA/ACC/AACVPR/AAPA/ABC/ACPM/ADA/AGS/APhA/ASPC/NLA/PCNA Guideline on the Management of Blood Cholesterol: A Report  of the [employer] of Cardiology/American Heart Association Task Force on Clinical Practice Guidelines. J Am Coll Cardiol. 2019;73(24):3168-3209.  Melinda Bruckner, MD   Electronically Signed By: Melinda Phelps M.D. On: 09/30/2022 21:02  Narrative EXAM: OVER-READ INTERPRETATION  CT CHEST  The following report is a limited chest CT over-read performed by radiologist Dr. Lynwood Landy Phelps of [facility] on 09/26/2022. This over-read does not include interpretation of cardiac or coronary anatomy or pathology. The calcium score CT interpretation by the cardiologist is attached.  COMPARISON:  None Available.  FINDINGS: Vascular: 4.3 cm ascending thoracic aortic aneurysm is noted.  Mediastinum/Nodes: Visualized mediastinum is unremarkable.  Lungs/Pleura: Visualized pulmonary parenchyma is unremarkable.  Upper Abdomen: Visualized portion of upper abdomen is unremarkable.  Musculoskeletal: Visualized skeleton is unremarkable.  IMPRESSION: 4.3 cm ascending thoracic aortic aneurysm. Recommend annual imaging followup by CTA or MRA. This recommendation follows 2010 ACCF/AHA/AATS/ACR/ASA/SCA/SCAI/SIR/STS/SVM Guidelines for the Diagnosis and Management of Patients with Thoracic Aortic Disease. Circulation. 2010; 121: Z733-z630. Aortic aneurysm NOS (ICD10-I71.9).  Electronically Signed: By: Melinda Phelps M.D. On: 09/26/2022 16:14     ______________________________________________________________________________________________      Risk Assessment/Calculations           Physical Exam VS:  BP 102/68   Pulse 85   Ht 5' 7 (1.702 m)   Wt 162 lb 12.8 oz (73.8 kg)   SpO2 98%   BMI 25.50 kg/m        Wt Readings from Last 3 Encounters:  10/16/24 162 lb 12.8 oz (73.8 kg)  10/16/24 161 lb 8 oz (73.3 kg)  09/30/24 157 lb (71.2 kg)    GEN: Well nourished, well developed in no acute distress NECK: No JVD; No carotid bruits CARDIAC: RRR, no  murmurs, rubs, gallops RESPIRATORY:  Clear to auscultation without rales, wheezing or rhonchi  ABDOMEN: Soft, non-tender, non-distended EXTREMITIES:  No edema; No deformity   ASSESSMENT AND PLAN  Nonobstructive CAD / HLD, LDL goal <70 - Stable with no anginal symptoms. No indication for ischemic evaluation.  GDMT atorvastatin 40mg  daily. 07/2024 LDL 68. Recommend aiming for 150 minutes of moderate intensity activity per week and following a heart healthy diet.    Ascending aortic aneurysm - 09/2022 ascending aorta 43mm ? 06/2023 ascending aorta 40mm. Discussed screening of first degree relatives. Recommend avoidance of fluoroquinolones. BP controlled without antihypertensive agent. Update CT Aorta for monitoring with BMP 3-7 days prior to CT Aorta.        Dispo: follow up 1 year  Signed, Reche GORMAN Finder, NP

## 2024-10-17 LAB — BASIC METABOLIC PANEL WITH GFR
BUN/Creatinine Ratio: 20 (ref 12–28)
BUN: 12 mg/dL (ref 8–27)
CO2: 26 mmol/L (ref 20–29)
Calcium: 10.2 mg/dL (ref 8.7–10.3)
Chloride: 100 mmol/L (ref 96–106)
Creatinine, Ser: 0.61 mg/dL (ref 0.57–1.00)
Glucose: 152 mg/dL — ABNORMAL HIGH (ref 70–99)
Potassium: 5.4 mmol/L — ABNORMAL HIGH (ref 3.5–5.2)
Sodium: 144 mmol/L (ref 134–144)
eGFR: 99 mL/min/1.73 (ref >59)

## 2024-10-19 ENCOUNTER — Ambulatory Visit (HOSPITAL_BASED_OUTPATIENT_CLINIC_OR_DEPARTMENT_OTHER): Payer: Self-pay | Admitting: Family

## 2024-10-19 DIAGNOSIS — I7121 Aneurysm of the ascending aorta, without rupture: Secondary | ICD-10-CM

## 2024-10-19 DIAGNOSIS — E875 Hyperkalemia: Secondary | ICD-10-CM

## 2024-10-19 DIAGNOSIS — Z79899 Other long term (current) drug therapy: Secondary | ICD-10-CM

## 2024-10-19 NOTE — Telephone Encounter (Signed)
-----   Message from Reche Finder, NP sent at 10/19/2024 10:22 AM EST ----- Normal kidney function.  Mild escalation potassium.  Ensure avoiding potassium supplements, salt substitute, electrolyte drinks.  This may reflect a lab error.  Would recommend repeat BMP Monday or  Tuesday for monitoring.

## 2024-10-19 NOTE — Telephone Encounter (Signed)
 The patient has been notified of the result and verbalized understanding.  All questions (if any) were answered.  Pt is aware we will repeat her BMET either today or tomorrow to ensure K level.   Pt is aware she can go to any [facility] closest to her.  Pt states she will go to the [facility] in [place], for she will be over that way tomorrow.   BMET placed and released.  Pt verbalized understanding and agrees with this plan.

## 2024-10-20 ENCOUNTER — Encounter: Payer: Self-pay | Admitting: *Deleted

## 2024-10-20 ENCOUNTER — Ambulatory Visit: Admitting: Gastroenterology

## 2024-10-20 ENCOUNTER — Other Ambulatory Visit: Payer: Self-pay | Admitting: *Deleted

## 2024-10-20 ENCOUNTER — Encounter: Payer: Self-pay | Admitting: Gastroenterology

## 2024-10-20 VITALS — BP 109/67 | HR 68 | Temp 97.7°F | Ht 67.0 in | Wt 161.2 lb

## 2024-10-20 DIAGNOSIS — Z83719 Family history of colon polyps, unspecified: Secondary | ICD-10-CM | POA: Insufficient documentation

## 2024-10-20 DIAGNOSIS — K5909 Other constipation: Secondary | ICD-10-CM | POA: Insufficient documentation

## 2024-10-20 DIAGNOSIS — Z860100 Personal history of colon polyps, unspecified: Secondary | ICD-10-CM | POA: Insufficient documentation

## 2024-10-20 MED ORDER — LUBIPROSTONE 24 MCG PO CAPS: 24.0000 ug | ORAL_CAPSULE | Freq: Two times a day (BID) | ORAL | 5 refills | Status: AC

## 2024-10-20 MED ORDER — NA SULFATE-K SULFATE-MG SULF 17.5-3.13-1.6 GM/177ML PO SOLN: 1.0000 | ORAL | 0 refills | Status: DC

## 2024-10-20 NOTE — H&P (View-Only) (Signed)
 GI Office Note    Referring Provider: Johnson Morna FALCON, NP Primary Care Physician:  Johnson Morna FALCON, NP  Primary Gastroenterologist: Carlin POUR. Cindie, DO   Chief Complaint   Chief Complaint  Patient presents with   Colonoscopy    History of Present Illness   Melinda Phelps is a 68 y.o. female presenting today at the request of [name], FNP, to schedule colonoscopy. Family history of colon polyps, brother. She believes she had single polyp in 2009, path unknown.  Discussed the use of AI scribe software for clinical note transcription with the patient, who gave verbal consent to proceed.  History of Present Illness Melinda Phelps is a 68 year old female who presents for evaluation of chronic constipation and preparation for a colonoscopy.  She has lifelong constipation with infrequent, hard bowel movements (Bristol 1-3) requiring straining. Over the past week she had a complete impaction that required an enema and she has not had a bowel movement since. She is using frequent enemas, magnesium citrate 300 mg daily, occasional Miralax , and two extra strength stool softeners daily.  She has been inconsistent with daily Miralax  and has never used prescription constipation medications. Her constipation worsened after starting Ozempic, which she takes 0.5 mg weekly for diabetes, has been on this for several years.   She had a colonoscopy in 2017 without polyps and recalls one polyp on a 2009 colonoscopy but does not recall if it was adenomatous. Her brother has colon polyps, again details unavailable.   She denies heartburn, indigestion, or abdominal pain apart from bloating with constipation. She has had gastric sleeve surgery and a hysterectomy.    Prior Data     Results    September 2025: A1c 7.5, hemoglobin 13.6, platelets 366, creatinine 0.54 albumin 4.1 total bilirubin 0.4, alk phos 90, AST 16, ALT  Colonoscopy 01/2016: -normal but tortuous  colon -colonoscopy 10 years    Medications   Current Outpatient Medications  Medication Sig Dispense Refill   atorvastatin (LIPITOR) 40 MG tablet Take 40 mg by mouth daily.       Biotin 5000 MCG CAPS Take 5,000 mcg by mouth daily.     clobetasol cream (TEMOVATE) 0.05 % Apply 1 application topically as directed.     Coenzyme Q10 (CO Q 10 PO) Take by mouth.     desloratadine (CLARINEX) 5 MG tablet Take 5 mg by mouth daily.       escitalopram (LEXAPRO) 20 MG tablet Take 20 mg by mouth daily.       fish oil-omega-3 fatty acids 1000 MG capsule Take 1 capsule by mouth daily.       Magnesium 250 MG TABS Take 250 mg by mouth daily. (Patient taking differently: Take 100 mg by mouth daily.)     Magnesium Citrate 100 MG TABS Take 300 mg by mouth.     metFORMIN (GLUCOPHAGE) 500 MG tablet Take 500 mg by mouth 2 (two) times daily with a meal.     Multiple Vitamin (MULTIVITAMIN) tablet Take 1 tablet by mouth daily.       ondansetron  (ZOFRAN -ODT) 4 MG disintegrating tablet Take 1 tablet (4 mg total) by mouth every 8 (eight) hours as needed for nausea or vomiting. 20 tablet 0   OZEMPIC, 0.25 OR 0.5 MG/DOSE, 2 MG/3ML SOPN Inject 0.5 mg into the skin once a week.     No current facility-administered medications for this visit.    Allergies   Allergies as of 10/20/2024   (  No Known Allergies)     Past Medical History   Past Medical History:  Diagnosis Date   Chest discomfort    Colon polyp 2010?   Depression    Diabetes mellitus    Hyperlipidemia    Sleep apnea     Past Surgical History   Past Surgical History:  Procedure Laterality Date   CESAREAN SECTION  1986, 1991, 1997   COLONOSCOPY  2010?   Dr Evalee in [place]   COLONOSCOPY WITH PROPOFOL  N/A 01/18/2016   Procedure: COLONOSCOPY WITH PROPOFOL ;  Surgeon: Reyes LELON Cota, MD;  Location: [facility] ENDOSCOPY;  Service: Endoscopy;  Laterality: N/A;   EXPLORATORY LAPAROTOMY  1986   HERNIA REPAIR  2004   LAPAROSCOPIC GASTRIC SLEEVE  RESECTION  2012   Dr Wolm   TONSILLECTOMY  1964   TOTAL ABDOMINAL HYSTERECTOMY W/ BILATERAL SALPINGOOPHORECTOMY  2003    Past Family History   Family History  Problem Relation Age of Onset   Heart failure Father    Heart attack Brother    Cancer Mother 87       lung    Past Social History   Social History   Socioeconomic History   Marital status: Widowed    Spouse name: Not on file   Number of children: Not on file   Years of education: Not on file   Highest education level: Not on file  Occupational History    Employer: [employer]  Tobacco Use   Smoking status: Never   Smokeless tobacco: Not on file  Substance and Sexual Activity   Alcohol use: No    Alcohol/week: 0.0 standard drinks of alcohol   Drug use: No   Sexual activity: Not on file  Other Topics Concern   Not on file  Social History Narrative   No regular exercise   Social Drivers of Health   Tobacco Use: Unknown (10/20/2024)   Patient History    Smoking Tobacco Use: Never    Smokeless Tobacco Use: Unknown    Passive Exposure: Not on file  Financial Resource Strain: Not on file  Food Insecurity: Not on file  Transportation Needs: Not on file  Physical Activity: Not on file  Stress: Not on file  Social Connections: Not on file  Intimate Partner Violence: Not on file  Depression (EYV7-0): Not on file  Alcohol Screen: Not on file  Housing: Not on file  Utilities: Not on file  Health Literacy: Not on file    Review of Systems   General: Negative for anorexia, weight loss, fever, chills, fatigue, weakness. ENT: Negative for hoarseness, difficulty swallowing , nasal congestion. CV: Negative for chest pain, angina, palpitations, dyspnea on exertion, peripheral edema.  Respiratory: Negative for dyspnea at rest, dyspnea on exertion, cough, sputum, wheezing.  GI: See history of present illness. GU:  Negative for dysuria, hematuria, urinary incontinence, urinary frequency, nocturnal urination.   Endo: Negative for unusual weight change.     Physical Exam   BP 109/67   Pulse 68   Temp 97.7 F (36.5 C) (Oral)   Ht 5' 7 (1.702 m)   Wt 161 lb 3.2 oz (73.1 kg)   SpO2 99%   BMI 25.25 kg/m    General: Well-nourished, well-developed in no acute distress.  Eyes: No icterus. Mouth: Oropharyngeal mucosa moist and pink   Lungs: Clear to auscultation bilaterally.  Heart: Regular rate and rhythm, no murmurs rubs or gallops.  Abdomen: Bowel sounds are normal, nontender, nondistended, no hepatosplenomegaly or masses,  no abdominal bruits or hernia , no rebound or guarding.  Rectal: not performed Extremities: No lower extremity edema. No clubbing or deformities. Neuro: Alert and oriented x 4   Skin: Warm and dry, no jaundice.   Psych: Alert and cooperative, normal mood and affect.  Labs   See above  Imaging Studies   MR FOOT RIGHT WO CONTRAST Result Date: 10/11/2024 CLINICAL DATA:  Right foot pain and swelling for 6 months EXAM: MR FOOT*R* W/O CM TECHNIQUE: Multiplanar, multisequence MR imaging of the right foot was performed. No intravenous contrast was administered. COMPARISON:  None Available. FINDINGS: TENDONS Peroneal: Mild tendinosis of the peroneus longus with subcortical marrow edema in the adjacent plantar lateral aspect of the cuboid. Mild tendinosis of the peroneus brevis. Posteromedial: Posterior tibial tendon intact. Flexor hallucis longus tendon intact. Flexor digitorum longus tendon intact. Anterior: Tibialis anterior tendon intact. Extensor hallucis longus tendon intact Extensor digitorum longus tendon intact. Achilles:  Mild distal Achilles tendinosis. Plantar Fascia: Chronic thickening of the central cord of the plantar fascia towards the calcaneal insertion. LIGAMENTS Lateral: Anterior talofibular ligament intact. Calcaneofibular ligament intact. Posterior talofibular ligament intact. Anterior and posterior tibiofibular ligaments intact. Medial: Deltoid ligament  intact. Spring ligament intact. CARTILAGE Ankle Joint: No joint effusion. Focal moderate partial-thickness cartilage loss of the posterior central tibial plafond with subchondral marrow edema. Subtalar Joints/Sinus Tarsi: Normal subtalar joints. No subtalar joint effusion. Normal sinus tarsi. Bones: No aggressive osseous lesion. No fracture or dislocation. 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 Mild osteoarthritis of the second TMT joint with subchondral marrow edema. Mild marrow edema in the proximal second, third, fourth and fifth metatarsals likely reflecting mild stress reaction. Soft Tissue: No fluid collection or hematoma. Muscles are normal without edema or atrophy. Tarsal tunnel is normal. IMPRESSION: 1. Mild tendinosis of the peroneus longus with subcortical marrow edema in the adjacent plantar lateral aspect of the cuboid. 2. Mild tendinosis of the peroneus brevis. 3. Mild distal Achilles tendinosis. 4. 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 5. Mild osteoarthritis of the second TMT joint with subchondral marrow edema. 6. Mild marrow edema in the proximal second, third, fourth and fifth metatarsals likely reflecting mild stress reaction. Electronically Signed   By: Julaine Blanch M.D.   On: 10/11/2024 09:00    Assessment/Plan:    Assessment & Plan Chronic constipation, need for screening colonoscopy (FH colon polyps, last colonoscopy 2017 without colon polyps): Chronic constipation exacerbated by Ozempic, leading to severe constipation. Has used various otc medications. Takes daily stool softener but otherwise has not been on daily regimen for her bowels.  - start lubiprostone  24mcg BID with food - cut back/discontinue magnesium citrate. If another daily regimen needed, would consider daily miralax  instead of magnesium citrate due to affects on kidneys.  - colonoscopy first of the year (per patient request).  ASA 3. Rm 1,2.  I have discussed the risks, alternatives, benefits with regards to but not limited to the risk of reaction to medication, bleeding, infection, perforation and the patient is agreeable to proceed. Written consent to be obtained. -she will let me know if her bowels are not moving adequate, goal of soft stool, at least 3-4 times per week.       Sonny RAMAN. Ezzard, MHS, PA-C [facility]

## 2024-10-20 NOTE — Progress Notes (Signed)
 GI Office Note    Referring Provider: Johnson Morna FALCON, NP Primary Care Physician:  Johnson Morna FALCON, NP  Primary Gastroenterologist: Carlin POUR. Cindie, DO   Chief Complaint   Chief Complaint  Patient presents with   Colonoscopy    History of Present Illness   Melinda Phelps is a 68 y.o. female presenting today at the request of [name], FNP, to schedule colonoscopy. Family history of colon polyps, brother. She believes she had single polyp in 2009, path unknown.  Discussed the use of AI scribe software for clinical note transcription with the patient, who gave verbal consent to proceed.  History of Present Illness Melinda Phelps is a 68 year old female who presents for evaluation of chronic constipation and preparation for a colonoscopy.  She has lifelong constipation with infrequent, hard bowel movements (Bristol 1-3) requiring straining. Over the past week she had a complete impaction that required an enema and she has not had a bowel movement since. She is using frequent enemas, magnesium citrate 300 mg daily, occasional Miralax , and two extra strength stool softeners daily.  She has been inconsistent with daily Miralax  and has never used prescription constipation medications. Her constipation worsened after starting Ozempic, which she takes 0.5 mg weekly for diabetes, has been on this for several years.   She had a colonoscopy in 2017 without polyps and recalls one polyp on a 2009 colonoscopy but does not recall if it was adenomatous. Her brother has colon polyps, again details unavailable.   She denies heartburn, indigestion, or abdominal pain apart from bloating with constipation. She has had gastric sleeve surgery and a hysterectomy.    Prior Data     Results    September 2025: A1c 7.5, hemoglobin 13.6, platelets 366, creatinine 0.54 albumin 4.1 total bilirubin 0.4, alk phos 90, AST 16, ALT  Colonoscopy 01/2016: -normal but tortuous  colon -colonoscopy 10 years    Medications   Current Outpatient Medications  Medication Sig Dispense Refill   atorvastatin (LIPITOR) 40 MG tablet Take 40 mg by mouth daily.       Biotin 5000 MCG CAPS Take 5,000 mcg by mouth daily.     clobetasol cream (TEMOVATE) 0.05 % Apply 1 application topically as directed.     Coenzyme Q10 (CO Q 10 PO) Take by mouth.     desloratadine (CLARINEX) 5 MG tablet Take 5 mg by mouth daily.       escitalopram (LEXAPRO) 20 MG tablet Take 20 mg by mouth daily.       fish oil-omega-3 fatty acids 1000 MG capsule Take 1 capsule by mouth daily.       Magnesium 250 MG TABS Take 250 mg by mouth daily. (Patient taking differently: Take 100 mg by mouth daily.)     Magnesium Citrate 100 MG TABS Take 300 mg by mouth.     metFORMIN (GLUCOPHAGE) 500 MG tablet Take 500 mg by mouth 2 (two) times daily with a meal.     Multiple Vitamin (MULTIVITAMIN) tablet Take 1 tablet by mouth daily.       ondansetron  (ZOFRAN -ODT) 4 MG disintegrating tablet Take 1 tablet (4 mg total) by mouth every 8 (eight) hours as needed for nausea or vomiting. 20 tablet 0   OZEMPIC, 0.25 OR 0.5 MG/DOSE, 2 MG/3ML SOPN Inject 0.5 mg into the skin once a week.     No current facility-administered medications for this visit.    Allergies   Allergies as of 10/20/2024   (  No Known Allergies)     Past Medical History   Past Medical History:  Diagnosis Date   Chest discomfort    Colon polyp 2010?   Depression    Diabetes mellitus    Hyperlipidemia    Sleep apnea     Past Surgical History   Past Surgical History:  Procedure Laterality Date   CESAREAN SECTION  1986, 1991, 1997   COLONOSCOPY  2010?   Dr Evalee in [place]   COLONOSCOPY WITH PROPOFOL  N/A 01/18/2016   Procedure: COLONOSCOPY WITH PROPOFOL ;  Surgeon: Reyes LELON Cota, MD;  Location: [facility] ENDOSCOPY;  Service: Endoscopy;  Laterality: N/A;   EXPLORATORY LAPAROTOMY  1986   HERNIA REPAIR  2004   LAPAROSCOPIC GASTRIC SLEEVE  RESECTION  2012   Dr Wolm   TONSILLECTOMY  1964   TOTAL ABDOMINAL HYSTERECTOMY W/ BILATERAL SALPINGOOPHORECTOMY  2003    Past Family History   Family History  Problem Relation Age of Onset   Heart failure Father    Heart attack Brother    Cancer Mother 87       lung    Past Social History   Social History   Socioeconomic History   Marital status: Widowed    Spouse name: Not on file   Number of children: Not on file   Years of education: Not on file   Highest education level: Not on file  Occupational History    Employer: [employer]  Tobacco Use   Smoking status: Never   Smokeless tobacco: Not on file  Substance and Sexual Activity   Alcohol use: No    Alcohol/week: 0.0 standard drinks of alcohol   Drug use: No   Sexual activity: Not on file  Other Topics Concern   Not on file  Social History Narrative   No regular exercise   Social Drivers of Health   Tobacco Use: Unknown (10/20/2024)   Patient History    Smoking Tobacco Use: Never    Smokeless Tobacco Use: Unknown    Passive Exposure: Not on file  Financial Resource Strain: Not on file  Food Insecurity: Not on file  Transportation Needs: Not on file  Physical Activity: Not on file  Stress: Not on file  Social Connections: Not on file  Intimate Partner Violence: Not on file  Depression (EYV7-0): Not on file  Alcohol Screen: Not on file  Housing: Not on file  Utilities: Not on file  Health Literacy: Not on file    Review of Systems   General: Negative for anorexia, weight loss, fever, chills, fatigue, weakness. ENT: Negative for hoarseness, difficulty swallowing , nasal congestion. CV: Negative for chest pain, angina, palpitations, dyspnea on exertion, peripheral edema.  Respiratory: Negative for dyspnea at rest, dyspnea on exertion, cough, sputum, wheezing.  GI: See history of present illness. GU:  Negative for dysuria, hematuria, urinary incontinence, urinary frequency, nocturnal urination.   Endo: Negative for unusual weight change.     Physical Exam   BP 109/67   Pulse 68   Temp 97.7 F (36.5 C) (Oral)   Ht 5' 7 (1.702 m)   Wt 161 lb 3.2 oz (73.1 kg)   SpO2 99%   BMI 25.25 kg/m    General: Well-nourished, well-developed in no acute distress.  Eyes: No icterus. Mouth: Oropharyngeal mucosa moist and pink   Lungs: Clear to auscultation bilaterally.  Heart: Regular rate and rhythm, no murmurs rubs or gallops.  Abdomen: Bowel sounds are normal, nontender, nondistended, no hepatosplenomegaly or masses,  no abdominal bruits or hernia , no rebound or guarding.  Rectal: not performed Extremities: No lower extremity edema. No clubbing or deformities. Neuro: Alert and oriented x 4   Skin: Warm and dry, no jaundice.   Psych: Alert and cooperative, normal mood and affect.  Labs   See above  Imaging Studies   MR FOOT RIGHT WO CONTRAST Result Date: 10/11/2024 CLINICAL DATA:  Right foot pain and swelling for 6 months EXAM: MR FOOT*R* W/O CM TECHNIQUE: Multiplanar, multisequence MR imaging of the right foot was performed. No intravenous contrast was administered. COMPARISON:  None Available. FINDINGS: TENDONS Peroneal: Mild tendinosis of the peroneus longus with subcortical marrow edema in the adjacent plantar lateral aspect of the cuboid. Mild tendinosis of the peroneus brevis. Posteromedial: Posterior tibial tendon intact. Flexor hallucis longus tendon intact. Flexor digitorum longus tendon intact. Anterior: Tibialis anterior tendon intact. Extensor hallucis longus tendon intact Extensor digitorum longus tendon intact. Achilles:  Mild distal Achilles tendinosis. Plantar Fascia: Chronic thickening of the central cord of the plantar fascia towards the calcaneal insertion. LIGAMENTS Lateral: Anterior talofibular ligament intact. Calcaneofibular ligament intact. Posterior talofibular ligament intact. Anterior and posterior tibiofibular ligaments intact. Medial: Deltoid ligament  intact. Spring ligament intact. CARTILAGE Ankle Joint: No joint effusion. Focal moderate partial-thickness cartilage loss of the posterior central tibial plafond with subchondral marrow edema. Subtalar Joints/Sinus Tarsi: Normal subtalar joints. No subtalar joint effusion. Normal sinus tarsi. Bones: No aggressive osseous lesion. No fracture or dislocation. 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 Mild osteoarthritis of the second TMT joint with subchondral marrow edema. Mild marrow edema in the proximal second, third, fourth and fifth metatarsals likely reflecting mild stress reaction. Soft Tissue: No fluid collection or hematoma. Muscles are normal without edema or atrophy. Tarsal tunnel is normal. IMPRESSION: 1. Mild tendinosis of the peroneus longus with subcortical marrow edema in the adjacent plantar lateral aspect of the cuboid. 2. Mild tendinosis of the peroneus brevis. 3. Mild distal Achilles tendinosis. 4. 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 5. Mild osteoarthritis of the second TMT joint with subchondral marrow edema. 6. Mild marrow edema in the proximal second, third, fourth and fifth metatarsals likely reflecting mild stress reaction. Electronically Signed   By: Julaine Blanch M.D.   On: 10/11/2024 09:00    Assessment/Plan:    Assessment & Plan Chronic constipation, need for screening colonoscopy (FH colon polyps, last colonoscopy 2017 without colon polyps): Chronic constipation exacerbated by Ozempic, leading to severe constipation. Has used various otc medications. Takes daily stool softener but otherwise has not been on daily regimen for her bowels.  - start lubiprostone  24mcg BID with food - cut back/discontinue magnesium citrate. If another daily regimen needed, would consider daily miralax  instead of magnesium citrate due to affects on kidneys.  - colonoscopy first of the year (per patient request).  ASA 3. Rm 1,2.  I have discussed the risks, alternatives, benefits with regards to but not limited to the risk of reaction to medication, bleeding, infection, perforation and the patient is agreeable to proceed. Written consent to be obtained. -she will let me know if her bowels are not moving adequate, goal of soft stool, at least 3-4 times per week.       Sonny RAMAN. Ezzard, MHS, PA-C [facility]

## 2024-10-20 NOTE — Patient Instructions (Addendum)
 I have sent in prescription for lubiprostone  24mcg twice daily with food. We do not have samples, since this is a generic.  The first week on lubiprostone  you may have loose/watery stools, this typically gets better. If you have more than 5 stools in 24 hours, you can skip a day of lubiprostone  or reduce to once daily dosing if needed.  Our goal is for your stools to be softer, no need to strain, and to have at least 3-4 stools per week. If you do not reach this goal after 1-2 weeks of lubiprostone , please reach out and let me know.   Hopefully you will not need magnesium citrate after starting lubiprostone  as magnesium citrate can be hard on your kidneys. Daily miralax  would be a better choice.   We will schedule you for a colonoscopy in the near future. Please make sure your bowels are moving better beforehand!

## 2024-10-21 ENCOUNTER — Encounter: Payer: Self-pay | Admitting: *Deleted

## 2024-10-21 ENCOUNTER — Telehealth: Payer: Self-pay | Admitting: *Deleted

## 2024-10-21 LAB — BASIC METABOLIC PANEL WITH GFR
BUN/Creatinine Ratio: 19 (ref 12–28)
BUN: 10 mg/dL (ref 8–27)
CO2: 25 mmol/L (ref 20–29)
Calcium: 10 mg/dL (ref 8.7–10.3)
Chloride: 100 mmol/L (ref 96–106)
Creatinine, Ser: 0.53 mg/dL — ABNORMAL LOW (ref 0.57–1.00)
Glucose: 194 mg/dL — ABNORMAL HIGH (ref 70–99)
Potassium: 4.9 mmol/L (ref 3.5–5.2)
Sodium: 142 mmol/L (ref 134–144)
eGFR: 102 mL/min/1.73 (ref >59)

## 2024-10-21 NOTE — Telephone Encounter (Signed)
 Pt wanted to reschedule her procedure on 11/13/24 to a later time. She has been rescheduled for 11/20/24 at 10:15 am. Updated instructions sent via mychart.

## 2024-10-22 ENCOUNTER — Ambulatory Visit (HOSPITAL_BASED_OUTPATIENT_CLINIC_OR_DEPARTMENT_OTHER): Admission: RE | Admit: 2024-10-22 | Discharge: 2024-10-22 | Attending: Family

## 2024-10-22 DIAGNOSIS — I7121 Aneurysm of the ascending aorta, without rupture: Secondary | ICD-10-CM | POA: Diagnosis present

## 2024-10-22 MED ORDER — IOHEXOL 350 MG/ML SOLN
75.0000 mL | Freq: Once | INTRAVENOUS | Status: AC | PRN
  Administered 2024-10-22: 17:00:00 75 mL via INTRAVENOUS

## 2024-10-26 ENCOUNTER — Encounter: Payer: Self-pay | Admitting: *Deleted

## 2024-10-26 NOTE — Telephone Encounter (Signed)
 LMOVM to return call.

## 2024-10-26 NOTE — Telephone Encounter (Signed)
-----   Message from Reche Finder, NP sent at 10/26/2024  8:57 AM EST ----- Ascending aortic aneurysm 4.1 cm which is stable from previous.  Recommend CT aorta in 1 year for monitoring.

## 2024-10-26 NOTE — Addendum Note (Signed)
 Addended by: GLADIS PORTER HERO on: 10/26/2024 09:16 AM   Modules accepted: Orders

## 2024-10-26 NOTE — Telephone Encounter (Signed)
 Results were sent to the pts active mychart account to review by Reche Finder, NP.   Will go ahead and place an order for repeat CT ANGIO AORTA in the system and make pt aware our scheduling dept will call her closer to that time frame to arrange this appt.   Order for CT ANGIO CHEST AORTA placed in the system for one year out, as well as a future BMET.   Will monitor for review.

## 2024-10-28 ENCOUNTER — Ambulatory Visit: Admitting: Cardiology

## 2024-11-10 ENCOUNTER — Encounter (HOSPITAL_COMMUNITY)

## 2024-11-11 ENCOUNTER — Ambulatory Visit (HOSPITAL_COMMUNITY): Admitting: Anesthesiology

## 2024-11-11 ENCOUNTER — Ambulatory Visit (HOSPITAL_COMMUNITY)
Admission: RE | Admit: 2024-11-11 | Discharge: 2024-11-11 | Disposition: A | Discharge status: Home / Self Care | Attending: Internal Medicine | Admitting: Internal Medicine

## 2024-11-11 ENCOUNTER — Encounter (HOSPITAL_COMMUNITY): Payer: Self-pay | Admitting: Internal Medicine

## 2024-11-11 ENCOUNTER — Other Ambulatory Visit: Payer: Self-pay

## 2024-11-11 ENCOUNTER — Encounter (HOSPITAL_COMMUNITY): Admission: RE | Disposition: A | Payer: Self-pay | Source: Home / Self Care | Attending: Internal Medicine

## 2024-11-11 DIAGNOSIS — Q438 Other specified congenital malformations of intestine: Secondary | ICD-10-CM | POA: Diagnosis not present

## 2024-11-11 DIAGNOSIS — D123 Benign neoplasm of transverse colon: Secondary | ICD-10-CM | POA: Diagnosis not present

## 2024-11-11 DIAGNOSIS — Z1211 Encounter for screening for malignant neoplasm of colon: Secondary | ICD-10-CM

## 2024-11-11 DIAGNOSIS — K562 Volvulus: Secondary | ICD-10-CM | POA: Diagnosis not present

## 2024-11-11 DIAGNOSIS — D128 Benign neoplasm of rectum: Secondary | ICD-10-CM

## 2024-11-11 DIAGNOSIS — Z83719 Family history of colon polyps, unspecified: Secondary | ICD-10-CM

## 2024-11-11 DIAGNOSIS — K648 Other hemorrhoids: Secondary | ICD-10-CM

## 2024-11-11 DIAGNOSIS — D120 Benign neoplasm of cecum: Secondary | ICD-10-CM

## 2024-11-11 HISTORY — PX: POLYPECTOMY: SHX149

## 2024-11-11 HISTORY — PX: COLONOSCOPY: SHX5424

## 2024-11-11 LAB — GLUCOSE, CAPILLARY: Glucose-Capillary: 106 mg/dL — ABNORMAL HIGH (ref 70–99)

## 2024-11-11 LAB — HM COLONOSCOPY

## 2024-11-11 SURGERY — COLONOSCOPY
Anesthesia: Monitor Anesthesia Care

## 2024-11-11 MED ORDER — PROPOFOL 500 MG/50ML IV EMUL
INTRAVENOUS | Status: DC | PRN
  Administered 2024-11-11: 100 mg via INTRAVENOUS
  Administered 2024-11-11: 150 ug/kg/min via INTRAVENOUS

## 2024-11-11 MED ORDER — LACTATED RINGERS IV SOLN: INTRAVENOUS | Status: DC

## 2024-11-11 NOTE — Interval H&P Note (Signed)
 History and Physical Interval Note:  11/11/2024 11:21 AM  Melinda Phelps  has presented today for surgery, with the diagnosis of SCREENING, FHx COLON POLYPS.  The various methods of treatment have been discussed with the patient and family. After consideration of risks, benefits and other options for treatment, the patient has consented to  Procedures with comments: COLONOSCOPY (N/A) - 915AM,  (OK RM1) as a surgical intervention.  The patient's history has been reviewed, patient examined, no change in status, stable for surgery.  I have reviewed the patient's chart and labs.  Questions were answered to the patient's satisfaction.     [name]

## 2024-11-11 NOTE — Transfer of Care (Signed)
 Immediate Anesthesia Transfer of Care Note  Patient: Melinda Phelps  Procedure(s) Performed: COLONOSCOPY POLYPECTOMY, INTESTINE  Patient Location: Endoscopy Unit  Anesthesia Type:General  Level of Consciousness: awake and alert   Airway & Oxygen Therapy: Patient Spontanous Breathing  Post-op Assessment: Report given to RN and Post -op Vital signs reviewed and stable  Post vital signs: Reviewed and stable  Last Vitals:  Vitals Value Taken Time  BP 99/68 11/11/24 12:39  Temp 36.4 C 11/11/24 12:39  Pulse 66 11/11/24 12:39  Resp 20 11/11/24 12:39  SpO2 98 % 11/11/24 12:39    Last Pain:  Vitals:   11/11/24 1239  TempSrc: Oral  PainSc:       Patients Stated Pain Goal: 5 (11/11/24 1130)  Complications: No notable events documented.

## 2024-11-11 NOTE — Op Note (Signed)
 [facility] Patient Name: Melinda Phelps Procedure Date: 11/11/2024 11:17 AM MRN: [id number] Date of Birth: 1955-12-25 Attending MD: Carlin POUR. Cindie , [place], [id number] CSN: [id number] Age: 69 Admit Type: Outpatient Procedure:                Colonoscopy Indications:              Screening for colorectal malignant neoplasm Providers:                Carlin POUR. Cindie, DO, Jon LABOR. Gerome RN, RN,                            Bascom Blush Referring MD:              Medicines:                See the Anesthesia note for documentation of the                            administered medications Complications:            No immediate complications. Estimated Blood Loss:     Estimated blood loss was minimal. Procedure:                Pre-Anesthesia Assessment:                           - The anesthesia plan was to use monitored                            anesthesia care (MAC).                           After obtaining informed consent, the colonoscope                            was passed under direct vision. Throughout the                            procedure, the patient's blood pressure, pulse, and                            oxygen saturations were monitored continuously. The                            [id number]-HQ190L ([id number]) Peds Colon was introduced                            through the anus and advanced to the the cecum,                            identified by appendiceal orifice and ileocecal                            valve. The colonoscopy was somewhat difficult due                            to a redundant colon  and significant looping.                            Successful completion of the procedure was aided by                            applying abdominal pressure. The patient tolerated                            the procedure well. The quality of the bowel                            preparation was evaluated using the BBPS [facility] [place]                            Bowel Preparation  Scale) with scores of: Right                            Colon = 2 (minor amount of residual staining, small                            fragments of stool and/or opaque liquid, but mucosa                            seen well), Transverse Colon = 2 (minor amount of                            residual staining, small fragments of stool and/or                            opaque liquid, but mucosa seen well) and Left Colon                            = 2 (minor amount of residual staining, small                            fragments of stool and/or opaque liquid, but mucosa                            seen well). The total BBPS score equals 6. The                            quality of the bowel preparation was good. Scope In: 12:12:50 PM Scope Out: 12:37:37 PM Scope Withdrawal Time: 0 hours 15 minutes 8 seconds  Total Procedure Duration: 0 hours 24 minutes 47 seconds  Findings:      Non-bleeding internal hemorrhoids were found.      A 6 mm polyp was found in the cecum. The polyp was sessile. The polyp       was removed with a cold snare. Resection and retrieval were complete.      Three sessile polyps were found in the transverse colon. The polyps were       6 to 8 mm in size. These polyps were  removed with a cold snare.       Resection and retrieval were complete.      A 5 mm polyp was found in the rectum. The polyp was sessile. The polyp       was removed with a cold snare. Resection and retrieval were complete. Impression:               - Non-bleeding internal hemorrhoids.                           - One 6 mm polyp in the cecum, removed with a cold                            snare. Resected and retrieved.                           - Three 6 to 8 mm polyps in the transverse colon,                            removed with a cold snare. Resected and retrieved.                           - One 5 mm polyp in the rectum, removed with a cold                            snare. Resected and  retrieved. Moderate Sedation:      Per Anesthesia Care Recommendation:           - Patient has a contact number available for                            emergencies. The signs and symptoms of potential                            delayed complications were discussed with the                            patient. Return to normal activities tomorrow.                            Written discharge instructions were provided to the                            patient.                           - Resume previous diet.                           - Continue present medications.                           - Await pathology results.                           - Repeat colonoscopy in 3 years for surveillance.                           -  Return to GI clinic PRN. Procedure Code(s):        --- Professional ---                           [phone], Colonoscopy, flexible; with removal of                            tumor(s), polyp(s), or other lesion(s) by snare                            technique Diagnosis Code(s):        --- Professional ---                           Z12.11, Encounter for screening for malignant                            neoplasm of colon                           D12.0, Benign neoplasm of cecum                           D12.8, Benign neoplasm of rectum                           D12.3, Benign neoplasm of transverse colon (hepatic                            flexure or splenic flexure)                           K64.8, Other hemorrhoids CPT copyright 2022 American Medical Association. All rights reserved. The codes documented in this report are preliminary and upon coder review may  be revised to meet current compliance requirements. Carlin POUR. Cindie, DO Carlin POUR. Cindie, DO 11/11/2024 12:42:29 PM This report has been signed electronically. Number of Addenda: 0

## 2024-11-11 NOTE — Anesthesia Preprocedure Evaluation (Signed)
"                                    Anesthesia Evaluation  Patient identified by MRN, date of birth, ID band Patient awake    Reviewed: Allergy & Precautions, H&P , NPO status , Patient's Chart, lab work & pertinent test results, reviewed documented beta blocker date and time   Airway Mallampati: II  TM Distance: >3 FB Neck ROM: full    Dental no notable dental hx.    Pulmonary neg pulmonary ROS, sleep apnea    Pulmonary exam normal breath sounds clear to auscultation       Cardiovascular Exercise Tolerance: Good hypertension, negative cardio ROS  Rhythm:regular Rate:Normal     Neuro/Psych  PSYCHIATRIC DISORDERS Anxiety Depression    negative neurological ROS  negative psych ROS   GI/Hepatic negative GI ROS, Neg liver ROS,,,  Endo/Other  negative endocrine ROSdiabetes    Renal/GU negative Renal ROS  negative genitourinary   Musculoskeletal   Abdominal   Peds  Hematology negative hematology ROS (+)   Anesthesia Other Findings   Reproductive/Obstetrics negative OB ROS                              Anesthesia Physical Anesthesia Plan  ASA: 2  Anesthesia Plan: MAC   Post-op Pain Management:    Induction:   PONV Risk Score and Plan: Propofol  infusion  Airway Management Planned:   Additional Equipment:   Intra-op Plan:   Post-operative Plan:   Informed Consent: I have reviewed the patients History and Physical, chart, labs and discussed the procedure including the risks, benefits and alternatives for the proposed anesthesia with the patient or authorized representative who has indicated his/her understanding and acceptance.     Dental Advisory Given  Plan Discussed with: CRNA  Anesthesia Plan Comments:         Anesthesia Quick Evaluation  "

## 2024-11-11 NOTE — Discharge Instructions (Signed)
" °  Colonoscopy Discharge Instructions  Read the instructions outlined below and refer to this sheet in the next few weeks. These discharge instructions provide you with general information on caring for yourself after you leave the hospital. Your doctor may also give you specific instructions. While your treatment has been planned according to the most current medical practices available, unavoidable complications occasionally occur.   ACTIVITY You may resume your regular activity, but move at a slower pace for the next 24 hours.  Take frequent rest periods for the next 24 hours.  Walking will help get rid of the air and reduce the bloated feeling in your belly (abdomen).  No driving for 24 hours (because of the medicine (anesthesia) used during the test).   Do not sign any important legal documents or operate any machinery for 24 hours (because of the anesthesia used during the test).  NUTRITION Drink plenty of fluids.  You may resume your normal diet as instructed by your doctor.  Begin with a light meal and progress to your normal diet. Heavy or fried foods are harder to digest and may make you feel sick to your stomach (nauseated).  Avoid alcoholic beverages for 24 hours or as instructed.  MEDICATIONS You may resume your normal medications unless your doctor tells you otherwise.  WHAT YOU CAN EXPECT TODAY Some feelings of bloating in the abdomen.  Passage of more gas than usual.  Spotting of blood in your stool or on the toilet paper.  IF YOU HAD POLYPS REMOVED DURING THE COLONOSCOPY: No aspirin products for 7 days or as instructed.  No alcohol for 7 days or as instructed.  Eat a soft diet for the next 24 hours.  FINDING OUT THE RESULTS OF YOUR TEST Not all test results are available during your visit. If your test results are not back during the visit, make an appointment with your caregiver to find out the results. Do not assume everything is normal if you have not heard from your  caregiver or the medical facility. It is important for you to follow up on all of your test results.  SEEK IMMEDIATE MEDICAL ATTENTION IF: You have more than a spotting of blood in your stool.  Your belly is swollen (abdominal distention).  You are nauseated or vomiting.  You have a temperature over 101.  You have abdominal pain or discomfort that is severe or gets worse throughout the day.   Your colonoscopy revealed 5 polyp(s) which I removed successfully. Await pathology results, my office will contact you. I recommend repeating colonoscopy in 3 years for surveillance purposes, depending on pathology results.  Otherwise follow up with GI as needed.    I hope you have a great rest of your week!  Carlin POUR. Cindie, D.O. Gastroenterology and Hepatology [facility]  "

## 2024-11-12 ENCOUNTER — Encounter (HOSPITAL_COMMUNITY): Payer: Self-pay | Admitting: Internal Medicine

## 2024-11-12 ENCOUNTER — Encounter (INDEPENDENT_AMBULATORY_CARE_PROVIDER_SITE_OTHER): Payer: Self-pay | Admitting: *Deleted

## 2024-11-13 LAB — SURGICAL PATHOLOGY

## 2024-11-13 NOTE — Anesthesia Postprocedure Evaluation (Signed)
"   Anesthesia Post Note  Patient: Melinda Phelps  Procedure(s) Performed: COLONOSCOPY POLYPECTOMY, INTESTINE  Patient location during evaluation: Phase II Anesthesia Type: MAC Level of consciousness: awake Pain management: pain level controlled Vital Signs Assessment: post-procedure vital signs reviewed and stable Respiratory status: spontaneous breathing and respiratory function stable Cardiovascular status: blood pressure returned to baseline and stable Postop Assessment: no headache and no apparent nausea or vomiting Anesthetic complications: no Comments: Late entry   No notable events documented.   Last Vitals:  Vitals:   11/11/24 1239 11/11/24 1243  BP: 99/68 104/68  Pulse: 66 63  Resp: 20 17  Temp: 36.4 C   SpO2: 98% 99%    Last Pain:  Vitals:   11/11/24 1243  TempSrc:   PainSc: 0-No pain                 Yvonna PARAS Cresta Riden      "

## 2024-11-16 ENCOUNTER — Encounter (HOSPITAL_COMMUNITY)

## 2024-11-16 ENCOUNTER — Ambulatory Visit: Payer: Self-pay | Admitting: Internal Medicine

## 2024-11-19 NOTE — Progress Notes (Signed)
5 yr TCS noted in recall procedure note and pathology result faxed to PCP

## 2024-11-24 ENCOUNTER — Other Ambulatory Visit: Payer: Self-pay | Admitting: Medical Genetics

## 2024-12-10 ENCOUNTER — Other Ambulatory Visit: Payer: Self-pay | Admitting: Medical Genetics

## 2024-12-10 DIAGNOSIS — Z006 Encounter for examination for normal comparison and control in clinical research program: Secondary | ICD-10-CM

## 2025-01-06 ENCOUNTER — Other Ambulatory Visit (HOSPITAL_COMMUNITY): Payer: Self-pay
